# Patient Record
Sex: Male | Born: 1965 | Race: Black or African American | Hispanic: No | Smoking: Current every day smoker
Health system: Southern US, Community
[De-identification: ages and names within clinical notes are randomized; demographics above are authoritative.]

## PROBLEM LIST (undated history)

## (undated) DIAGNOSIS — B351 Tinea unguium: Secondary | ICD-10-CM

## (undated) DIAGNOSIS — I4891 Unspecified atrial fibrillation: Secondary | ICD-10-CM

## (undated) DIAGNOSIS — F1010 Alcohol abuse, uncomplicated: Secondary | ICD-10-CM

## (undated) DIAGNOSIS — Z87891 Personal history of nicotine dependence: Secondary | ICD-10-CM

## (undated) HISTORY — DX: Personal history of nicotine dependence: Z87.891

## (undated) HISTORY — DX: Tinea unguium: B35.1

## (undated) HISTORY — DX: Alcohol abuse, uncomplicated: F10.10

## (undated) HISTORY — DX: Unspecified atrial fibrillation: I48.91

---

## 1999-04-07 DIAGNOSIS — Z794 Long term (current) use of insulin: Secondary | ICD-10-CM

## 1999-04-07 DIAGNOSIS — E119 Type 2 diabetes mellitus without complications: Secondary | ICD-10-CM | POA: Insufficient documentation

## 1999-08-13 ENCOUNTER — Emergency Department (HOSPITAL_COMMUNITY): Admission: EM | Admit: 1999-08-13 | Discharge: 1999-08-13 | Payer: Self-pay | Admitting: Emergency Medicine

## 1999-08-15 ENCOUNTER — Emergency Department (HOSPITAL_COMMUNITY): Admission: EM | Admit: 1999-08-15 | Discharge: 1999-08-15 | Payer: Self-pay | Admitting: *Deleted

## 1999-08-19 ENCOUNTER — Encounter: Admission: RE | Admit: 1999-08-19 | Discharge: 1999-08-19 | Payer: Self-pay | Admitting: Hematology and Oncology

## 1999-08-24 ENCOUNTER — Encounter: Admission: RE | Admit: 1999-08-24 | Discharge: 1999-11-22 | Payer: Self-pay | Admitting: *Deleted

## 1999-08-26 ENCOUNTER — Encounter: Admission: RE | Admit: 1999-08-26 | Discharge: 1999-08-26 | Payer: Self-pay | Admitting: Hematology and Oncology

## 1999-09-14 ENCOUNTER — Encounter: Admission: RE | Admit: 1999-09-14 | Discharge: 1999-09-14 | Payer: Self-pay | Admitting: Internal Medicine

## 1999-11-09 ENCOUNTER — Encounter: Admission: RE | Admit: 1999-11-09 | Discharge: 1999-11-09 | Payer: Self-pay | Admitting: Internal Medicine

## 2000-03-15 ENCOUNTER — Emergency Department (HOSPITAL_COMMUNITY): Admission: EM | Admit: 2000-03-15 | Discharge: 2000-03-15 | Payer: Self-pay | Admitting: Emergency Medicine

## 2000-03-23 ENCOUNTER — Emergency Department (HOSPITAL_COMMUNITY): Admission: EM | Admit: 2000-03-23 | Discharge: 2000-03-23 | Payer: Self-pay | Admitting: Emergency Medicine

## 2000-04-03 ENCOUNTER — Encounter: Payer: Self-pay | Admitting: Emergency Medicine

## 2000-04-03 ENCOUNTER — Emergency Department (HOSPITAL_COMMUNITY): Admission: EM | Admit: 2000-04-03 | Discharge: 2000-04-03 | Payer: Self-pay | Admitting: Emergency Medicine

## 2000-10-31 ENCOUNTER — Emergency Department (HOSPITAL_COMMUNITY): Admission: EM | Admit: 2000-10-31 | Discharge: 2000-11-01 | Payer: Self-pay | Admitting: Emergency Medicine

## 2001-02-07 ENCOUNTER — Emergency Department (HOSPITAL_COMMUNITY): Admission: EM | Admit: 2001-02-07 | Discharge: 2001-02-07 | Payer: Self-pay | Admitting: Emergency Medicine

## 2003-10-15 ENCOUNTER — Encounter: Admission: RE | Admit: 2003-10-15 | Discharge: 2003-10-15 | Payer: Self-pay | Admitting: Internal Medicine

## 2003-10-29 ENCOUNTER — Encounter: Admission: RE | Admit: 2003-10-29 | Discharge: 2003-10-29 | Payer: Self-pay | Admitting: Internal Medicine

## 2003-12-03 ENCOUNTER — Encounter: Admission: RE | Admit: 2003-12-03 | Discharge: 2003-12-03 | Payer: Self-pay | Admitting: Internal Medicine

## 2003-12-06 ENCOUNTER — Ambulatory Visit (HOSPITAL_COMMUNITY): Admission: RE | Admit: 2003-12-06 | Discharge: 2003-12-06 | Payer: Self-pay | Admitting: Internal Medicine

## 2003-12-06 ENCOUNTER — Encounter: Admission: RE | Admit: 2003-12-06 | Discharge: 2003-12-06 | Payer: Self-pay | Admitting: Internal Medicine

## 2003-12-13 ENCOUNTER — Encounter: Admission: RE | Admit: 2003-12-13 | Discharge: 2003-12-13 | Payer: Self-pay | Admitting: Internal Medicine

## 2004-02-26 ENCOUNTER — Ambulatory Visit: Payer: Self-pay | Admitting: Internal Medicine

## 2004-04-08 ENCOUNTER — Encounter (HOSPITAL_BASED_OUTPATIENT_CLINIC_OR_DEPARTMENT_OTHER): Admission: RE | Admit: 2004-04-08 | Discharge: 2004-04-21 | Payer: Self-pay | Admitting: Internal Medicine

## 2004-05-31 DIAGNOSIS — I4891 Unspecified atrial fibrillation: Secondary | ICD-10-CM

## 2004-05-31 HISTORY — DX: Unspecified atrial fibrillation: I48.91

## 2005-02-23 ENCOUNTER — Emergency Department (HOSPITAL_COMMUNITY): Admission: EM | Admit: 2005-02-23 | Discharge: 2005-02-23 | Payer: Self-pay | Admitting: Emergency Medicine

## 2005-03-02 ENCOUNTER — Emergency Department (HOSPITAL_COMMUNITY): Admission: EM | Admit: 2005-03-02 | Discharge: 2005-03-02 | Payer: Self-pay | Admitting: Family Medicine

## 2005-03-09 ENCOUNTER — Ambulatory Visit: Payer: Self-pay | Admitting: Cardiology

## 2005-03-09 ENCOUNTER — Ambulatory Visit: Payer: Self-pay | Admitting: Internal Medicine

## 2005-03-09 ENCOUNTER — Encounter (INDEPENDENT_AMBULATORY_CARE_PROVIDER_SITE_OTHER): Payer: Self-pay | Admitting: Cardiology

## 2005-03-09 ENCOUNTER — Inpatient Hospital Stay (HOSPITAL_COMMUNITY): Admission: EM | Admit: 2005-03-09 | Discharge: 2005-03-10 | Payer: Self-pay | Admitting: Emergency Medicine

## 2005-03-23 ENCOUNTER — Ambulatory Visit: Payer: Self-pay

## 2005-06-14 ENCOUNTER — Ambulatory Visit: Payer: Self-pay | Admitting: Internal Medicine

## 2005-08-24 ENCOUNTER — Ambulatory Visit: Payer: Self-pay | Admitting: Internal Medicine

## 2005-10-11 ENCOUNTER — Encounter (INDEPENDENT_AMBULATORY_CARE_PROVIDER_SITE_OTHER): Payer: Self-pay | Admitting: *Deleted

## 2005-10-22 ENCOUNTER — Emergency Department (HOSPITAL_COMMUNITY): Admission: EM | Admit: 2005-10-22 | Discharge: 2005-10-22 | Payer: Self-pay | Admitting: Emergency Medicine

## 2005-10-26 ENCOUNTER — Ambulatory Visit: Payer: Self-pay | Admitting: Internal Medicine

## 2005-10-26 ENCOUNTER — Encounter (INDEPENDENT_AMBULATORY_CARE_PROVIDER_SITE_OTHER): Payer: Self-pay | Admitting: *Deleted

## 2005-10-27 ENCOUNTER — Encounter (INDEPENDENT_AMBULATORY_CARE_PROVIDER_SITE_OTHER): Payer: Self-pay | Admitting: *Deleted

## 2005-10-27 LAB — CONVERTED CEMR LAB: Microalbumin U total vol: 0.7 mg/L

## 2006-01-19 ENCOUNTER — Emergency Department (HOSPITAL_COMMUNITY): Admission: EM | Admit: 2006-01-19 | Discharge: 2006-01-19 | Payer: Self-pay | Admitting: Emergency Medicine

## 2006-04-06 ENCOUNTER — Encounter (INDEPENDENT_AMBULATORY_CARE_PROVIDER_SITE_OTHER): Payer: Self-pay | Admitting: *Deleted

## 2006-04-06 DIAGNOSIS — F1011 Alcohol abuse, in remission: Secondary | ICD-10-CM

## 2006-04-06 DIAGNOSIS — I480 Paroxysmal atrial fibrillation: Secondary | ICD-10-CM | POA: Insufficient documentation

## 2006-04-06 DIAGNOSIS — B351 Tinea unguium: Secondary | ICD-10-CM | POA: Insufficient documentation

## 2006-04-06 DIAGNOSIS — F17201 Nicotine dependence, unspecified, in remission: Secondary | ICD-10-CM | POA: Insufficient documentation

## 2006-05-03 ENCOUNTER — Encounter (INDEPENDENT_AMBULATORY_CARE_PROVIDER_SITE_OTHER): Payer: Self-pay | Admitting: *Deleted

## 2006-05-03 ENCOUNTER — Ambulatory Visit: Payer: Self-pay | Admitting: *Deleted

## 2007-02-10 ENCOUNTER — Ambulatory Visit: Payer: Self-pay | Admitting: Internal Medicine

## 2007-02-10 ENCOUNTER — Encounter (INDEPENDENT_AMBULATORY_CARE_PROVIDER_SITE_OTHER): Payer: Self-pay | Admitting: *Deleted

## 2007-02-10 LAB — CONVERTED CEMR LAB
Creatinine, Urine: 108.2 mg/dL
Leukocytes, UA: NEGATIVE
Microalb Creat Ratio: 8.4 mg/g (ref 0.0–30.0)
Microalb, Ur: 0.91 mg/dL (ref 0.00–1.89)
Potassium: 4.4 meq/L (ref 3.5–5.3)
Protein, ur: NEGATIVE mg/dL
RBC / HPF: NONE SEEN (ref <3)
Sodium: 136 meq/L (ref 135–145)
Specific Gravity, Urine: 1.031 — ABNORMAL HIGH (ref 1.005–1.03)
Urine Glucose: >1000 mg/dL — AB
WBC, UA: NONE SEEN cells/hpf (ref <3)
pH: 6 (ref 5.0–8.0)

## 2007-02-23 ENCOUNTER — Ambulatory Visit: Payer: Self-pay | Admitting: Internal Medicine

## 2007-02-23 ENCOUNTER — Encounter (INDEPENDENT_AMBULATORY_CARE_PROVIDER_SITE_OTHER): Payer: Self-pay | Admitting: *Deleted

## 2007-02-23 LAB — CONVERTED CEMR LAB: Blood Glucose, Fingerstick: 196

## 2007-03-20 ENCOUNTER — Encounter: Payer: Self-pay | Admitting: *Deleted

## 2007-03-29 ENCOUNTER — Encounter (INDEPENDENT_AMBULATORY_CARE_PROVIDER_SITE_OTHER): Payer: Self-pay | Admitting: *Deleted

## 2007-03-29 ENCOUNTER — Ambulatory Visit: Payer: Self-pay | Admitting: *Deleted

## 2008-03-10 ENCOUNTER — Emergency Department (HOSPITAL_COMMUNITY): Admission: EM | Admit: 2008-03-10 | Discharge: 2008-03-10 | Payer: Self-pay | Admitting: Emergency Medicine

## 2009-03-03 ENCOUNTER — Encounter: Payer: Self-pay | Admitting: Internal Medicine

## 2009-03-03 ENCOUNTER — Ambulatory Visit: Payer: Self-pay | Admitting: Internal Medicine

## 2009-03-03 LAB — CONVERTED CEMR LAB
Albumin: 4.5 g/dL (ref 3.5–5.2)
CO2: 19 meq/L (ref 19–32)
Cholesterol: 285 mg/dL — ABNORMAL HIGH (ref 0–200)
Glucose, Bld: 334 mg/dL — ABNORMAL HIGH (ref 70–99)
MCV: 86.7 fL (ref >=78.0)
Microalb, Ur: 1.72 mg/dL (ref 0.00–1.89)
RBC: 5.04 M/uL (ref 4.22–5.81)
Sodium: 135 meq/L (ref 135–145)
Total Bilirubin: 0.8 mg/dL (ref 0.3–1.2)
Total Protein: 7.3 g/dL (ref 6.0–8.3)
Triglycerides: 145 mg/dL (ref <150)
VLDL: 29 mg/dL (ref 0–40)
WBC: 6 10*3/uL (ref 4.0–10.5)

## 2009-03-24 ENCOUNTER — Telehealth: Payer: Self-pay | Admitting: Internal Medicine

## 2009-04-02 ENCOUNTER — Telehealth (INDEPENDENT_AMBULATORY_CARE_PROVIDER_SITE_OTHER): Payer: Self-pay | Admitting: *Deleted

## 2009-04-02 ENCOUNTER — Telehealth: Payer: Self-pay | Admitting: *Deleted

## 2009-04-10 ENCOUNTER — Ambulatory Visit: Payer: Self-pay | Admitting: Internal Medicine

## 2009-05-01 ENCOUNTER — Ambulatory Visit: Payer: Self-pay | Admitting: Internal Medicine

## 2009-05-01 LAB — CONVERTED CEMR LAB: Blood Glucose, Fingerstick: 256

## 2009-07-18 ENCOUNTER — Telehealth: Payer: Self-pay | Admitting: *Deleted

## 2009-08-01 ENCOUNTER — Ambulatory Visit: Payer: Self-pay | Admitting: Internal Medicine

## 2009-08-01 ENCOUNTER — Encounter: Payer: Self-pay | Admitting: Internal Medicine

## 2009-08-01 DIAGNOSIS — E785 Hyperlipidemia, unspecified: Secondary | ICD-10-CM

## 2009-08-01 LAB — CONVERTED CEMR LAB
AST: 14 units/L (ref 0–37)
Albumin: 4.2 g/dL (ref 3.5–5.2)
Alkaline Phosphatase: 74 units/L (ref 39–117)
Blood Glucose, Fingerstick: 251
Hgb A1c MFr Bld: 8.8 %
Potassium: 4.1 meq/L (ref 3.5–5.3)
Sodium: 137 meq/L (ref 135–145)
Total Protein: 7.1 g/dL (ref 6.0–8.3)

## 2009-09-22 ENCOUNTER — Ambulatory Visit: Payer: Self-pay | Admitting: Internal Medicine

## 2009-09-22 LAB — CONVERTED CEMR LAB
ALT: 12 units/L (ref 0–53)
Alkaline Phosphatase: 62 units/L (ref 39–117)
Indirect Bilirubin: 0.7 mg/dL (ref 0.0–0.9)
Total Protein: 7.2 g/dL (ref 6.0–8.3)

## 2009-11-25 ENCOUNTER — Encounter: Payer: Self-pay | Admitting: Internal Medicine

## 2009-11-25 ENCOUNTER — Ambulatory Visit: Payer: Self-pay | Admitting: Internal Medicine

## 2009-11-25 ENCOUNTER — Ambulatory Visit (HOSPITAL_COMMUNITY): Admission: RE | Admit: 2009-11-25 | Discharge: 2009-11-25 | Payer: Self-pay | Admitting: Internal Medicine

## 2009-12-03 ENCOUNTER — Ambulatory Visit: Payer: Self-pay | Admitting: Internal Medicine

## 2009-12-03 LAB — CONVERTED CEMR LAB
CO2: 18 meq/L — ABNORMAL LOW (ref 19–32)
Calcium: 8.9 mg/dL (ref 8.4–10.5)
Cholesterol: 168 mg/dL (ref 0–200)
Creatinine, Ser: 0.99 mg/dL (ref 0.40–1.50)
Glucose, Bld: 153 mg/dL — ABNORMAL HIGH (ref 70–99)
Sodium: 143 meq/L (ref 135–145)
Total Bilirubin: 0.7 mg/dL (ref 0.3–1.2)
Total Protein: 6.7 g/dL (ref 6.0–8.3)
Triglycerides: 82 mg/dL (ref <150)
VLDL: 16 mg/dL (ref 0–40)

## 2009-12-16 ENCOUNTER — Emergency Department (HOSPITAL_COMMUNITY): Admission: EM | Admit: 2009-12-16 | Discharge: 2009-12-16 | Payer: Self-pay | Admitting: Family Medicine

## 2010-02-27 ENCOUNTER — Telehealth: Payer: Self-pay | Admitting: *Deleted

## 2010-03-31 ENCOUNTER — Ambulatory Visit: Payer: Self-pay | Admitting: Internal Medicine

## 2010-03-31 DIAGNOSIS — G43009 Migraine without aura, not intractable, without status migrainosus: Secondary | ICD-10-CM | POA: Insufficient documentation

## 2010-03-31 LAB — HM DIABETES FOOT EXAM

## 2010-03-31 LAB — CONVERTED CEMR LAB
Blood Glucose, Fingerstick: 198
Creatinine, Urine: 186.2 mg/dL
Microalb Creat Ratio: 2.7 mg/g (ref 0.0–30.0)

## 2010-04-03 ENCOUNTER — Telehealth: Payer: Self-pay | Admitting: Internal Medicine

## 2010-04-09 ENCOUNTER — Telehealth: Payer: Self-pay | Admitting: Licensed Clinical Social Worker

## 2010-04-09 ENCOUNTER — Encounter: Payer: Self-pay | Admitting: Licensed Clinical Social Worker

## 2010-06-21 ENCOUNTER — Encounter: Payer: Self-pay | Admitting: Internal Medicine

## 2010-06-30 NOTE — Letter (Signed)
Summary: METER DOWNLOAD  METER DOWNLOAD   Imported By: Margie Billet 11/27/2009 10:21:38  _____________________________________________________________________  External Attachment:    Type:   Image     Comment:   External Document

## 2010-06-30 NOTE — Letter (Signed)
Summary: Pharmacologist   Imported By: Florinda Marker 08/05/2009 16:36:56  _____________________________________________________________________  External Attachment:    Type:   Image     Comment:   External Document

## 2010-06-30 NOTE — Assessment & Plan Note (Signed)
Summary: NOT HFU 2 MONTH RECHECK PER [facility]   Vital Signs:  Patient profile:   45 year old male Height:      72.25 inches (183.52 cm) Weight:      204.5 pounds (92.95 kg) BMI:     27.64 Temp:     98.8 degrees F (37.11 degrees C) oral Pulse rate:   105 / minute BP sitting:   125 / 80  (left arm) Cuff size:   large  Vitals Entered By: Cynda Familia Duncan Dull) (September 22, 2009 1:36 PM) CC: , Lipid Management Is Patient Diabetic? Yes  Does patient need assistance? Functional Status Self care Ambulation Normal   Primary Care Provider:  Deatra Robinson MD  CC:   and Lipid Management.  History of Present Illness: F/U appointment: 1. DM -takes his meds as prescribed (uses samples of Novlog 70/30 flexipens) --denies hypoglycemia; watches his diet. 2. HLD -started on statin 2 months ago. Denies any side-effects. 3. Could not f/u with a Fundoscopic exam --awaits  [place] "orange card."  Lipid Management History:      Positive NCEP/ATP III risk factors include male age 88 years old or older and diabetes.  Negative NCEP/ATP III risk factors include non-tobacco-user status.        The patient states that he knows about the "Therapeutic Lifestyle Change" diet.  His compliance with the TLC diet is good.  The patient expresses understanding of adjunctive measures for cholesterol lowering.  Adjunctive measures started by the patient include aerobic exercise, fiber, ASA, and limit alcohol consumpton.  He expresses no side effects from his lipid-lowering medication.  The patient denies any symptoms to suggest myopathy or liver disease.     Preventive Screening-Counseling & Management  Alcohol-Tobacco     Alcohol drinks/day: 0     Alcohol type: BEER     Smoking Status: quit     Packs/Day: 0.5     Year Started: 1990     Year Quit: 2009  Current Problems (verified): 1)  Hyperlipidemia  (ICD-272.4) 2)  Onychomycosis, Toenails  (ICD-110.1) 3)  Tobacco Abuse   (ICD-305.1) 4)  Alcohol Abuse  (ICD-305.00) 5)  Diabetes Mellitus, Type II  (ICD-250.00) 6)  Atrial Fibrillation  (ICD-427.31)  Current Medications (verified): 1)  Novolin 70/30 70-30 % Susp (Insulin Isophane & Regular) .... Inject 15 Units Subcutaneously in The Morning and 20 Units in The Evening. 2)  Truetrack Test  Strp (Glucose Blood) .... Use To Check Blood Sugars Three Times A Day 3)  Lancets  Misc (Lancets) .... Use To Check Blood Sugar 3x Daily Before Meals 4)  Aspirin 81 Mg Chew (Aspirin) .... Take 1 Tablet By Mouth Once A Day With Meals 5)  Pravastatin Sodium 40 Mg Tabs (Pravastatin Sodium) .... Take 1 Tablet By Mouth Once A Day  Allergies (verified): No Known Drug Allergies  Past History:  Family History: Last updated: 03/03/2009 mother --MI at age of 26 y/o, DM, HTN father --cancer (unknown type); at unknown age  Social History: Last updated: 03/03/2009 Current Smoker Alcohol use-quit in 2009. Illicit drugs --no  Dropped out of the 11th grade in high school used to work in [employer]. No family in [place] (2 sisters and a brother in [place]. [place])  Risk Factors: Smoking Status: quit (09/22/2009) Packs/Day: 0.5 (09/22/2009)  Past medical, surgical, family and social histories (including risk factors) reviewed for relevance to current acute and chronic problems.  Past Medical History: Reviewed history from 04/06/2006 and no changes required. onychomycosis  Diabetes mellitus, type II:  uncontrolled alcohol abuse tobacco abuse Atrial  fibrillation with RVR in 2006 with spontaneous conversion  Family History: Reviewed history from 03/03/2009 and no changes required. mother --MI at age of 39 y/o, DM, HTN father --cancer (unknown type); at unknown age  Social History: Reviewed history from 03/03/2009 and no changes required. Current Smoker Alcohol use-quit in 2009. Illicit drugs --no  Dropped out of the 11th grade in high school used to work in  [employer]. No family in [place] (2 sisters and a brother in [place]. [place])  Review of Systems       per HPI  Physical Exam  General:  alert and well-developed.  alert and well-developed.   Head:  no abnormalities observed.  no abnormalities observed.   Eyes:  vision grossly intact by Snellen chart. EOMI bilaterally; no icterus bialterally. Ears:  no external deformities.   Nose:  no external erythema and no nasal discharge.  no external erythema and no nasal discharge.   Mouth:  pharynx pink and moist, no erythema, no exudates, no postnasal drip, no lesions, and no tongue abnormalities.  pharynx pink and moist, no erythema, no exudates, no postnasal drip, no lesions, and no tongue abnormalities.   Neck:  supple, full ROM, and no masses.  supple, full ROM, and no masses.   Lungs:  normal respiratory effort and normal breath sounds.   Heart:  normal rate and regular rhythm.  normal rate and regular rhythm.   Abdomen:  soft and non-tender.  soft and non-tender.   Msk:  normal ROM, no joint tenderness, no joint swelling, no joint warmth, no redness over joints, no joint deformities, no joint instability, and no crepitation.    Pulses:  normal peripheral pulses  Extremities:  no cyanosis, clubbing or edema  Neurologic:  non focal. alert & oriented X3, strength normal in all extremities, sensation intact to light touch, gait normal, DTRs symmetrical and normal, finger-to-nose normal, and heel-to-shin normal.   Skin:  turgor normal, color normal, no rashes, and no suspicious lesions.  Psych:  normally interactive.  normally interactive.    Diabetes Management Exam:    Foot Exam (with socks and/or shoes not present):       Sensory-Pinprick/Light touch:          Left medial foot (L-4): normal          Left dorsal foot (L-5): normal          Left lateral foot (S-1): normal          Right medial foot (L-4): normal          Right dorsal foot (L-5): normal          Right lateral foot  (S-1): normal       Sensory-Monofilament:          Left foot: normal          Right foot: normal       Inspection:          Left foot: normal          Right foot: normal       Nails:          Left foot: thickened          Right foot: thickened   Impression & Recommendations:  Problem # 1:  HYPERLIPIDEMIA (ICD-272.4)  Started on Statin 2 months ago. Denies any side-effects. Will check LFT's today. Diet discussed. His updated medication list for this problem includes:  Pravastatin Sodium 40 Mg Tabs (Pravastatin sodium) .Marland Kitchen... Take 1 tablet by mouth once a day  Orders: T-Hepatic Function [phone])  Labs Reviewed: SGOT: 14 (08/01/2009)   SGPT: 15 (08/01/2009)   HDL:56 (03/03/2009)  LDL:200 (03/03/2009)  Chol:285 (03/03/2009)  Trig:145 (03/03/2009)  Problem # 2:  DIABETES MELLITUS, TYPE II (ICD-250.00)  Medications, signs of hypoglycemia, diet, exercise,foot care reviewed with the patient. Patient is to see Ms. Hulen Shouts for DME. Awaiting for an "Orange card" in order to be referrred for a fundoscopic exam. His updated medication list for this problem includes:    Novolin 70/30 70-30 % Susp (Insulin isophane & regular) ..... Inject 15 units subcutaneously in the morning and 20 units in the evening.    Aspirin 81 Mg Chew (Aspirin) .Marland Kitchen... Take 1 tablet by mouth once a day with meals  Labs Reviewed: Creat: 0.95 (08/01/2009)   Microalbumin: 0.70 (10/27/2005) Reviewed HgBA1c results: 8.8 (08/01/2009)  >14.0 (03/03/2009)  Complete Medication List: 1)  Novolin 70/30 70-30 % Susp (Insulin isophane & regular) .... Inject 15 units subcutaneously in the morning and 20 units in the evening. 2)  Truetrack Test Strp (Glucose blood) .... Use to check blood sugars three times a day 3)  Lancets Misc (Lancets) .... Use to check blood sugar 3x daily before meals 4)  Aspirin 81 Mg Chew (Aspirin) .... Take 1 tablet by mouth once a day with meals 5)  Pravastatin Sodium 40 Mg Tabs (Pravastatin  sodium) .... Take 1 tablet by mouth once a day  Lipid Assessment/Plan:      Based on NCEP/ATP III, the patient's risk factor category is "history of diabetes".  The patient's lipid goals are as follows: Total cholesterol goal is 200; LDL cholesterol goal is 100; HDL cholesterol goal is 40; Triglyceride goal is 150.     Patient Instructions: 1)  Please, make an appointment with Ms. Rhiley for diabetic teaching once every 3 months. 2)  Please, make a follow up appintment at the end of June . 3)  Make sure to take your medications as prescribed. 4)  Please, bring your glucose meter with each appointment. 5)  Follow up with an eye exam. Process Orders Check Orders Results:     [facility]: ABN not required for this insurance Tests Sent for requisitioning (September 22, 2009 4:21 PM):     09/22/2009: [facility] -- T-Hepatic Function [phone] (signed)    Prevention & Chronic Care Immunizations   Influenza vaccine: refuses  (03/29/2007)   Influenza vaccine deferral: Refused  (03/03/2009)    Tetanus booster: Not documented   Td booster deferral: Refused  (03/03/2009)    Pneumococcal vaccine: Not documented  Other Screening   PSA: Not documented   Smoking status: quit  (09/22/2009)  Diabetes Mellitus   HgbA1C: 8.8  (08/01/2009)   HgbA1C action/deferral: Ordered  (03/03/2009)   Hemoglobin A1C due: 06/03/2009    Eye exam: Not documented   Diabetic eye exam action/deferral: Ophthalmology referral  (03/03/2009)    Foot exam: yes  (09/22/2009)   Foot exam action/deferral: Do today   High risk foot: Not documented   Foot care education: Done  (03/03/2009)    Urine microalbumin/creatinine ratio: 18.4  (03/03/2009)   Urine microalbumin action/deferral: Ordered   Urine microalbumin/cr due: 03/03/2010  Lipids   Total Cholesterol: 285  (03/03/2009)   Lipid panel action/deferral: Lipid Panel ordered   LDL: 200  (03/03/2009)   LDL Direct: Not  documented   HDL: 56  (03/03/2009)  Triglycerides: 145  (03/03/2009)   Lipid panel due: 09/01/2009    SGOT (AST): 14  (08/01/2009)   SGPT (ALT): 15  (08/01/2009)   Alkaline phosphatase: 74  (08/01/2009)   Total bilirubin: 1.0  (08/01/2009)  Self-Management Support :   Personal Goals (by the next clinic visit) :     Personal A1C goal: 8  (04/10/2009)     Personal blood pressure goal: 140/90  (04/10/2009)     Personal LDL goal: 100  (04/10/2009)    Patient will work on the following items until the next clinic visit to reach self-care goals:     Medications and monitoring: take my medicines every day  (09/22/2009)     Eating: drink diet soda or water instead of juice or soda, eat foods that are low in salt, eat baked foods instead of fried foods  (09/22/2009)     Activity: join a walking program  (09/22/2009)     Other: bring meter every visit  (09/22/2009)    Diabetes self-management support: Pre-printed educational material, Resources for patients handout, Written self-care plan  (09/22/2009)   Diabetes care plan printed    Lipid self-management support: Pre-printed educational material, Resources for patients handout, Written self-care plan  (09/22/2009)   Lipid self-care plan printed.      Resource handout printed.

## 2010-06-30 NOTE — Progress Notes (Signed)
  Phone Note Outgoing Call   Call placed by: Theotis Barrio NT II,  July 18, 2009 11:37 AM Call placed to: Patient Details for Reason: [facility] Summary of Call: CALLED AND LEFT VOICE MESSAGE FOR THE PATIENT TO CALL [facility] / SECOND # IS A FAX NUMBER. Yi Falletta NT II  July 18, 2009 11:38 AM

## 2010-06-30 NOTE — Assessment & Plan Note (Signed)
Summary: NOT HFU PER Seleta Hovland F/U VISIT/CH   Vital Signs:  Patient profile:   45 year old male Height:      72.25 inches (183.52 cm) Weight:      203.6 pounds (92.55 kg) BMI:     27.52 Temp:     97.7 degrees F (36.50 degrees C) oral Pulse rate:   91 / minute BP sitting:   131 / 83  (right arm)  Vitals Entered By: Cynda Familia Duncan Dull) (November 25, 2009 2:09 PM) Is Patient Diabetic? Yes Did you bring your meter with you today? Yes Pain Assessment Patient in pain? no      Nutritional Status BMI of > 30 = obese CBG Result 215  Have you ever been in a relationship where you felt threatened, hurt or afraid?No   Does patient need assistance? Functional Status Self care Ambulation Normal   Primary Care Provider:  Deatra Robinson MD   History of Present Illness: 1. F/u on DM. Takes all his medications as prescribed. Now covered though [employer]. Denies any hypoglycemic events. 2. HLD-denies any side-effects with Pravastatin. 3. Continues to smoke  Preventive Screening-Counseling & Management  Alcohol-Tobacco     Alcohol drinks/day: 0     Alcohol type: BEER     Smoking Status: quit     Packs/Day: 0.5     Year Started: 1990     Year Quit: 2010  Current Problems (verified): 1)  Hyperlipidemia  (ICD-272.4) 2)  Onychomycosis, Toenails  (ICD-110.1) 3)  Tobacco Abuse  (ICD-305.1) 4)  Alcohol Abuse  (ICD-305.00) 5)  Diabetes Mellitus, Type II  (ICD-250.00) 6)  Atrial Fibrillation  (ICD-427.31)  Medications Prior to Update: 1)  Novolin 70/30 70-30 % Susp (Insulin Isophane & Regular) .... Inject 15 Units Subcutaneously in The Morning and 20 Units in The Evening. 2)  Truetrack Test  Strp (Glucose Blood) .... Use To Check Blood Sugars Three Times A Day 3)  Lancets  Misc (Lancets) .... Use To Check Blood Sugar 3x Daily Before Meals 4)  Aspirin 81 Mg Chew (Aspirin) .... Take 1 Tablet By Mouth Once A Day With Meals 5)  Pravastatin Sodium 40 Mg Tabs (Pravastatin  Sodium) .... Take 1 Tablet By Mouth Once A Day  Allergies (verified): No Known Drug Allergies  Past History:  Family History: Last updated: 03/03/2009 mother --MI at age of 28 y/o, DM, HTN father --cancer (unknown type); at unknown age  Social History: Last updated: 03/03/2009 Current Smoker Alcohol use-quit in 2009. Illicit drugs --no  Dropped out of the 11th grade in high school used to work in [employer]. No family in [place] (2 sisters and a brother in [place]. [place])  Risk Factors: Smoking Status: quit (11/25/2009) Packs/Day: 0.5 (11/25/2009)  Past medical, surgical, family and social histories (including risk factors) reviewed, and no changes noted (except as noted below).  Past Medical History: Reviewed history from 04/06/2006 and no changes required. onychomycosis Diabetes mellitus, type II:  uncontrolled alcohol abuse tobacco abuse Atrial  fibrillation with RVR in 2006 with spontaneous conversion  Family History: Reviewed history from 03/03/2009 and no changes required. mother --MI at age of 41 y/o, DM, HTN father --cancer (unknown type); at unknown age  Social History: Reviewed history from 03/03/2009 and no changes required. Current Smoker Alcohol use-quit in 2009. Illicit drugs --no  Dropped out of the 11th grade in high school used to work in [employer]. No family in [place] (2 sisters and a brother in [place])  Review of  Systems       per HPI  Physical Exam  General:  alert and well-developed.  alert and well-developed.   Head:  no abnormalities observed.  no abnormalities observed.   Eyes:  vision grossly intact by Snellen chart. EOMI bilaterally; no icterus bialterally. Ears:  no external deformities.   Nose:  no external erythema and no nasal discharge.  no external erythema and no nasal discharge.   Mouth:  pharynx pink and moist, no erythema, no exudates, no postnasal drip, no lesions, and no tongue abnormalities.   pharynx pink and moist, no erythema, no exudates, no postnasal drip, no lesions, and no tongue abnormalities.   Neck:  supple, full ROM, and no masses.  supple, full ROM, and no masses.   Lungs:  normal respiratory effort and normal breath sounds.   Heart:  normal rate and regular rhythm.  normal rate and regular rhythm.   Abdomen:  soft and non-tender.  soft and non-tender.   Msk:  normal ROM, no joint tenderness, no joint swelling, no joint warmth, no redness over joints, no joint deformities, no joint instability, and no crepitation.    Pulses:  normal peripheral pulses  Extremities:  no cyanosis, clubbing or edema  Neurologic:  alert & oriented X3, sensation intact to light touch, gait normal, and DTRs symmetrical and normal.   Skin:  turgor normal, color normal, no rashes, and no suspicious lesions.  Psych:  normally interactive.   Oriented X3, memory intact for recent and remote, good eye contact, not anxious appearing, and not depressed appearing.    Diabetes Management Exam:    Foot Exam (with socks and/or shoes not present):       Sensory-Pinprick/Light touch:          Left medial foot (L-4): normal          Left dorsal foot (L-5): normal          Left lateral foot (S-1): normal          Right medial foot (L-4): normal          Right dorsal foot (L-5): normal          Right lateral foot (S-1): normal       Sensory-Monofilament:          Left foot: normal          Right foot: normal       Inspection:          Left foot: normal          Right foot: normal       Nails:          Left foot: normal          Right foot: normal   Impression & Recommendations:  Problem # 1:  DIABETES MELLITUS, TYPE II (ICD-250.00)  Increase Insulin to 20 Units Subcutaneously in am and 25 Units Sq at bedtime. Sx of hypoglycemia reviewed. Diet discussed. Patient is to follow up with his opthalmology referral. His updated medication list for this problem includes:    Novolin 70/30 70-30 % Susp  (Insulin isophane & regular) ..... Inject 20 units subcutaneously in the morning and 25 units in the evening.    Aspirin 81 Mg Chew (Aspirin) .Marland Kitchen... Take 1 tablet by mouth once a day with meals  Orders: T-Hgb A1C (in-house) ([id number]) T- Capillary Blood Glucose ([id number]) T-Comprehensive Metabolic Panel ([id number]) T-Hgb A1C (in-house) ([id number])  Labs Reviewed: Creat: 0.95 (08/01/2009)   Microalbumin: 0.70 (10/27/2005) Reviewed  HgBA1c results: 8.7 (11/25/2009)  8.8 (08/01/2009)  Problem # 2:  HYPERLIPIDEMIA (ICD-272.4)  Denies any side-effects with Pravastatin. Diet, exercise discussed. Patient maintaince abstinence from ETOH. His updated medication list for this problem includes:    Pravastatin Sodium 40 Mg Tabs (Pravastatin sodium) .Marland Kitchen... Take 1 tablet by mouth once a day  Orders: T-Comprehensive Metabolic Panel [id number]) T-Lipid Profile ([id number])  Labs Reviewed: SGOT: 14 (09/22/2009)   SGPT: 12 (09/22/2009)  Lipid Goals: Chol Goal: 200 (09/22/2009)   HDL Goal: 40 (09/22/2009)   LDL Goal: 100 (09/22/2009)   TG Goal: 150 (09/22/2009)  Prior 10 Yr Risk Heart Disease: 11 % (09/22/2009)   HDL:56 (03/03/2009)  LDL:200 (03/03/2009)  Chol:285 (03/03/2009)  Trig:145 (03/03/2009)  Problem # 3:  TOBACCO ABUSE (ICD-305.1)  Encouraged smoking cessation and discussed different methods for smoking cessation.   Complete Medication List: 1)  Novolin 70/30 70-30 % Susp (Insulin isophane & regular) .... Inject 20 units subcutaneously in the morning and 25 units in the evening. 2)  Truetrack Test Strp (Glucose blood) .... Use to check blood sugars three times a day 3)  Lancets Misc (Lancets) .... Use to check blood sugar 3x daily before meals 4)  Aspirin 81 Mg Chew (Aspirin) .... Take 1 tablet by mouth once a day with meals 5)  Pravastatin Sodium 40 Mg Tabs (Pravastatin sodium) .... Take 1 tablet by mouth once a day 6)  Lancets Misc (Lancets) .... Check blood glucose before breakfast  once a day 7)  Sm Insulin Syringe 31g X 5/16" 0.5 Ml Misc (Insulin syringe-needle u-100) .... Use with novolog 70/30 as instructed twice daily  Other Orders: 12 Lead EKG (12 Lead EKG)  Patient Instructions: 1)  Please, increase Novolog Insulin 70/30 dose to: 2)  20 Units every morning befire breakfast and 25 units before dinner. 3)  YOur prescriptions were faxed to yourpharmacy. 4)  Call with any questions. 5)  Please, make an appointment for a blood draw --you need to be FASTING for 12 hours (nothing by mouth except water). Do not inject insulin if skip meals. 6)  Follow up in 3-4 months. Prescriptions: NOVOLIN 70/30 70-30 % SUSP (INSULIN ISOPHANE & REGULAR) Inject 20 units subcutaneously in the morning and 25 units in the evening.  #100 x 11   Entered and Authorized by:   Deatra Robinson MD   Signed by:   Deatra Robinson MD on 11/25/2009   Method used:   Faxed to ...       [facility] (retail)       [address]       [place], [place]  [address]       Ph: [phone]       Fax: [phone]   RxID:   [id number] SM INSULIN SYRINGE 31G X 5/16" 0.5 ML MISC (INSULIN SYRINGE-NEEDLE U-100) Use with Novolog 70/30 as instructed twice daily  #100 x 11   Entered and Authorized by:   Deatra Robinson MD   Signed by:   Deatra Robinson MD on 11/25/2009   Method used:   Faxed to ...       [facility] (retail)       [address]       [place], [place]  [address]       Ph: [phone]       Fax: [phone]   RxID:   [id number] LANCETS  MISC (LANCETS) Check blood glucose before breakfast once a day  #100  x 11   Entered and Authorized by:   Deatra Robinson MD   Signed by:   Deatra Robinson MD on 11/25/2009   Method used:   Faxed to ...       [facility] (retail)       [address]       [place], [place]  [address]       Ph: [phone]       Fax: [phone]    RxID:   [id number] NOVOLIN 70/30 70-30 % SUSP (INSULIN ISOPHANE & REGULAR) Inject 15 units subcutaneously in the morning and 20 units in the evening.  #100 x 11   Entered and Authorized by:   Deatra Robinson MD   Signed by:   Deatra Robinson MD on 11/25/2009   Method used:   Faxed to ...       [facility] (retail)       [address]       [place], [place]  [address]       Ph: [phone]       Fax: [phone]   RxID:   [id number] PRAVASTATIN SODIUM 40 MG TABS (PRAVASTATIN SODIUM) Take 1 tablet by mouth once a day  #30 x 11   Entered and Authorized by:   Deatra Robinson MD   Signed by:   Deatra Robinson MD on 11/25/2009   Method used:   Faxed to ...       [facility] (retail)       [address]       [place], [place]  [address]       Ph: [phone]       Fax: [phone]   RxID:   [id number] ASPIRIN 81 MG CHEW (ASPIRIN) Take 1 tablet by mouth once a day with meals  #30 x 11   Entered and Authorized by:   Deatra Robinson MD   Signed by:   Deatra Robinson MD on 11/25/2009   Method used:   Faxed to ...       [facility] (retail)       [address]       [place], [place]  [address]       Ph: [phone]       Fax: [phone]   RxID:   [id number] CVELFYBOF TEST  STRP (GLUCOSE BLOOD) use to check blood sugars three times a day  #90 x 11   Entered and Authorized by:   Deatra Robinson MD   Signed by:   Deatra Robinson MD on 11/25/2009   Method used:   Faxed to ...       [facility] (retail)       [address]       [place], [place]  [address]       Ph: [phone]       Fax: [phone]   RxID:   [id number] LANCETS  MISC (LANCETS) use to check blood sugar 3x daily before meals  #100 x 11   Entered and Authorized by:   Deatra Robinson MD   Signed by:   Deatra Robinson MD on 11/25/2009   Method  used:   Faxed to ...       [facility] (retail)       [address]       [place], [place]  [address]  Ph: [phone]       Fax: [phone]   RxID:   [id number]  Process Orders Check Orders Results:     [facility]: ABN not required for this insurance Tests Sent for requisitioning (November 25, 2009 2:51 PM):     11/25/2009: [facility] -- T-Comprehensive Metabolic Panel [[id number]] (signed)     11/25/2009: [facility] -- T-Lipid Profile [id number] (signed)    Laboratory Results   Blood Tests   Date/Time Received: November 25, 2009 2:36 PM Date/Time Reported: Alric Quan  November 25, 2009 2:37 PM   HGBA1C: 8.7%   (Normal Range: Non-Diabetic - 3-6%   Control Diabetic - 6-8%) CBG Random:: 215mg /dL

## 2010-06-30 NOTE — Progress Notes (Signed)
  Phone Note Outgoing Call   Summary of Call: Tel. mailbox full and not accepting messages/will send letter home.

## 2010-06-30 NOTE — Assessment & Plan Note (Signed)
Summary: RECK/KARIMOVA/VS   Vital Signs:  Patient profile:   45 year old male Height:      72.25 inches (183.52 cm) Weight:      205.1 pounds (89.55 kg) BMI:     27.72 Temp:     97.4 degrees F (36.33 degrees C) oral Pulse rate:   102 / minute BP sitting:   130 / 79  (right arm) Cuff size:   regular  Vitals Entered By: Theotis Barrio NT II (August 01, 2009 9:29 AM) CC: 3 MONTH OFFICE VISIT Is Patient Diabetic? Yes Did you bring your meter with you today? Yes Pain Assessment Patient in pain? no      Nutritional Status BMI of > 30 = obese CBG Result 251  Have you ever been in a relationship where you felt threatened, hurt or afraid?No   Does patient need assistance? Functional Status Self care Ambulation Normal Comments HERER FOR HIS 3 MONTH OFFICE VISIT   Primary Care Provider:  Deatra Robinson MD  CC:  3 MONTH OFFICE VISIT.  History of Present Illness: Terry Castillo is a 45 year old Male with DM, h/o a fib, alcohol abuse and tobacco abuse. In here for a follow up visit. CBG at home are in 135 - 150s. Doing fine and doesn't have any new complaints today.   Depression History:      The patient denies a depressed mood most of the day and a diminished interest in his usual daily activities.         Current Medications (verified): 1)  Novolin 70/30 70-30 % Susp (Insulin Isophane & Regular) .... Inject 15 Units Subcutaneously in The Morning and 20 Units in The Evening. 2)  Truetrack Test  Strp (Glucose Blood) .... Use To Check Blood Sugars Three Times A Day 3)  Lancets  Misc (Lancets) .... Use To Check Blood Sugar 3x Daily Before Meals 4)  Aspirin 81 Mg Chew (Aspirin) .... Take 1 Tablet By Mouth Once A Day With Meals  Allergies (verified): No Known Drug Allergies  Past History:  Past Medical History: Last updated: 04/06/2006 onychomycosis Diabetes mellitus, type II:  uncontrolled alcohol abuse tobacco abuse Atrial  fibrillation with RVR in 2006 with  spontaneous conversion  Family History: Last updated: 03/03/2009 mother --MI at age of 48 y/o, DM, HTN father --cancer (unknown type); at unknown age  Social History: Last updated: 03/03/2009 Current Smoker Alcohol use-quit in 2009. Illicit drugs --no  Dropped out of the 11th grade in high school used to work in [employer]. No family in [place] (2 sisters and a brother in [place]. [place])  Risk Factors: Alcohol Use: 0 (05/01/2009) Caffeine Use: 1 cup of cofee a day (05/01/2009) Exercise: no (05/01/2009)  Risk Factors: Smoking Status: quit (05/01/2009) Packs/Day: 0.5 (05/01/2009)  Review of Systems       as per HPI.   Physical Exam  General:  alert and well-developed.  alert and well-developed.   Lungs:  normal respiratory effort and normal breath sounds.   Heart:  normal rate and regular rhythm.  normal rate and regular rhythm.   Abdomen:  soft and non-tender.  soft and non-tender.   Pulses:  normal peripheral pulses  Extremities:  no cyanosis, clubbing or edema  Neurologic:  non focal.  Psych:  normally interactive.  normally interactive.     Impression & Recommendations:  Problem # 1:  DIABETES MELLITUS, TYPE II (ICD-250.00) Data reviewed: A1c: >14.0 (03/03/2009 2:36:49 PM)  MICROALB/CR: 18.4 (03/03/2009 8:50:00 PM) LDL:  200 (03/03/2009 8:50:00 PM) EYE: pending FOOT: yes (05/01/2009 8:53:18 AM) A1c to be done today: 8.8. I only got three readings on his CBG meter. I have asked him to check it at least twice a day and then will be able to make some adjustments to the insulin dose. If persistently higher than 130 or 180 (fasting and post meal), he will give [place] a call. Awaiting eye exam. Elevated LDL noted, will start him on statin.   His updated medication list for this problem includes:    Novolin 70/30 70-30 % Susp (Insulin isophane & regular) ..... Inject 15 units subcutaneously in the morning and 20 units in the evening.    Aspirin 81 Mg Chew  (Aspirin) .Marland Kitchen... Take 1 tablet by mouth once a day with meals  Orders: T- Capillary Blood Glucose ([id number]) T-Hgb A1C (in-house) ([id number])  Problem # 2:  HYPERLIPIDEMIA (ICD-272.4) Start statin, along with life style changes. Will get baseline LFT today.   Orders: T-Comprehensive Metabolic Panel [id number])  His updated medication list for this problem includes:    Pravastatin Sodium 40 Mg Tabs (Pravastatin sodium) .Marland Kitchen... Take 1 tablet by mouth once a day  Complete Medication List: 1)  Novolin 70/30 70-30 % Susp (Insulin isophane & regular) .... Inject 15 units subcutaneously in the morning and 20 units in the evening. 2)  Truetrack Test Strp (Glucose blood) .... Use to check blood sugars three times a day 3)  Lancets Misc (Lancets) .... Use to check blood sugar 3x daily before meals 4)  Aspirin 81 Mg Chew (Aspirin) .... Take 1 tablet by mouth once a day with meals 5)  Pravastatin Sodium 40 Mg Tabs (Pravastatin sodium) .... Take 1 tablet by mouth once a day  Patient Instructions: 1)  We will let you know if anything wrong with your lab work (noted your home number: [phone]). 2)  Please schedule a follow-up appointment in 2 months. 3)  A new medicine has been started for your high cholesterol.  Prescriptions: PRAVASTATIN SODIUM 40 MG TABS (PRAVASTATIN SODIUM) Take 1 tablet by mouth once a day  #30 x 5   Entered and Authorized by:   Zara Council MD   Signed by:   Zara Council MD on 08/01/2009   Method used:   Faxed to ...       [facility] (retail)       [address]       [place], [place]  [address]       Ph: [phone]       Fax: [phone]   RxID:   [id number]  Process Orders Check Orders Results:     [facility]: ABN not required for this insurance Tests Sent for requisitioning (August 01, 2009 1:05 PM):     08/01/2009: [facility] -- T-Comprehensive Metabolic Panel [phone]  (signed)    Prevention & Chronic Care Immunizations   Influenza vaccine: refuses  (03/29/2007)   Influenza vaccine deferral: Refused  (03/03/2009)    Tetanus booster: Not documented   Td booster deferral: Refused  (03/03/2009)    Pneumococcal vaccine: Not documented  Other Screening   PSA: Not documented   Smoking status: quit  (05/01/2009)  Diabetes Mellitus   HgbA1C: 8.8  (08/01/2009)   HgbA1C action/deferral: Ordered  (03/03/2009)   Hemoglobin A1C due: 06/03/2009    Eye exam: Not documented   Diabetic eye exam action/deferral: Ophthalmology referral  (03/03/2009)    Foot exam: yes  (  05/01/2009)   Foot exam action/deferral: Do today   High risk foot: Not documented   Foot care education: Done  (03/03/2009)    Urine microalbumin/creatinine ratio: 18.4  (03/03/2009)   Urine microalbumin action/deferral: Ordered   Urine microalbumin/cr due: 03/03/2010    Diabetes flowsheet reviewed?: Yes   Progress toward A1C goal: Unchanged  Lipids   Total Cholesterol: 285  (03/03/2009)   Lipid panel action/deferral: Lipid Panel ordered   LDL: 200  (03/03/2009)   LDL Direct: Not documented   HDL: 56  (03/03/2009)   Triglycerides: 145  (03/03/2009)   Lipid panel due: 09/01/2009    SGOT (AST): 10  (03/03/2009)   SGPT (ALT): 9  (03/03/2009) CMP ordered    Alkaline phosphatase: 97  (03/03/2009)   Total bilirubin: 0.8  (03/03/2009)    Lipid flowsheet reviewed?: Yes   Progress toward LDL goal: Unchanged  Self-Management Support :   Personal Goals (by the next clinic visit) :     Personal A1C goal: 8  (04/10/2009)     Personal blood pressure goal: 140/90  (04/10/2009)     Personal LDL goal: 100  (04/10/2009)    Patient will work on the following items until the next clinic visit to reach self-care goals:     Medications and monitoring: take my medicines every day, check my blood sugar, examine my feet every day  (08/01/2009)     Eating: drink diet soda or water instead of  juice or soda, eat more vegetables, use fresh or frozen vegetables, eat foods that are low in salt, eat baked foods instead of fried foods, limit or avoid alcohol  (08/01/2009)     Activity: take a 30 minute walk every day  (08/01/2009)     Other: drinking fruit juices often - diluting with extra ice to decrease sugar - unable to exercise during winter  (05/01/2009)    Diabetes self-management support: Written self-care plan  (04/10/2009)    Lipid self-management support: Written self-care plan  (04/10/2009)    Laboratory Results   Blood Tests   Date/Time Received: August 01, 2009 10:11 AM  Date/Time Reported: Burke Keels  August 01, 2009 10:12 AM   HGBA1C: 8.8%   (Normal Range: Non-Diabetic - 3-6%   Control Diabetic - 6-8%) CBG Random:: 251mg /dL

## 2010-06-30 NOTE — Progress Notes (Signed)
Summary: change insulin/ hla  Phone Note From Pharmacy   Summary of Call[facility] from novolin 70/30 to humulin 70/30, may they? Initial call taken by: Marin Roberts RN,  February 27, 2010 4:19 PM  Follow-up for Phone Call        yes. Follow-up by: Zoila Shutter MD,  March 02, 2010 2:49 PM

## 2010-06-30 NOTE — Letter (Signed)
Summary: Generic Letter  [facility]  [address]   [place], [place] [address]   Phone: [phone]  Fax: [phone]       04/09/2010  LADON VANDENBERGHE [address] [place], [place]  [address]  Dear Mr. FERRICK,  I tried reaching you by phone but was not successful.  Your doctor asked that I contact you and discuss possible resources that might be of help to you.  Please call me at [phone] at your convenience.    Thank you,     Dorothe Pea, LCSW Clinical Social Worker [facility]

## 2010-06-30 NOTE — Assessment & Plan Note (Signed)
Summary: EST-3 MONTH RECHECK/CH   Vital Signs:  Patient profile:   45 year old male Height:      72.25 inches (183.52 cm) Weight:      211.5 pounds (96.14 kg) Temp:     98.7 degrees F (37.06 degrees C) oral Pulse rate:   101 / minute BP sitting:   122 / 73  (left arm) Cuff size:   large  Vitals Entered By: Cynda Familia Duncan Dull) (March 31, 2010 3:10 PM) CC: f/u, recurrent HAs Is Patient Diabetic? Yes Did you bring your meter with you today? No Pain Assessment Patient in pain? no      Nutritional Status BMI of > 30 = obese CBG Result 198  Have you ever been in a relationship where you felt threatened, hurt or afraid?No   Does patient need assistance? Functional Status Self care Ambulation Normal   Diabetic Foot Exam Last Podiatry Exam Date: 02/10/2007 Foot Inspection Is there a history of a foot ulcer?              No Is there a foot ulcer now?              No Is there swelling or an abnormal foot shape?          No Are the toenails long?                No Are the toenails thick?                No Are the toenails ingrown?              No Is there heavy callous build-up?              No Is there pain in the calf muscle (Intermittent claudication) when walking?    NoIs there a claw toe deformity?              No Is there elevated skin temperature?            No Is there limited ankle dorsiflexion?            No Is there foot or ankle muscle weakness?            No  Diabetic Foot Care Education Patient educated on appropriate care of diabetic feet.  Pulse Check          Right Foot          Left Foot Posterior Tibial:        normal            normal Dorsalis Pedis:        normal            normal  High Risk Feet? Yes   10-g (5.07) Semmes-Weinstein Monofilament Test           Right Foot          Left Foot Visual Inspection               Test Control      normal         normal Site 1         normal         normal Site 2         normal          normal Site 3         normal         normal Site 4  normal         normal Site 5         normal         normal Site 6         normal         normal Site 7         normal         normal Site 8         normal         normal Site 9         normal         normal Site 10         normal         normal  Impression      normal         normal     Primary Care Provider:  Deatra Robinson MD  CC:  f/u and recurrent HAs.  History of Present Illness: Follow up on: 1. DM.  Patient denies any side-effects of the medications or hypoglycemic events. Adherent with his regimen. 2. HA. left sided, periorbital, sharpt, pulsatile, for 2 weeks. No nocturnal Sx; no dizziness, no speech or visual dieficits, no fever, chills, or weakness. Aura with mild photosensitivity; no N/V; no trauma. No personal or FMHx of migraines. Patient states that had been under a great deal of stress from being unable to find an employment; Px with paying his bills; lives with his friend.  Preventive Screening-Counseling & Management  Alcohol-Tobacco     Alcohol drinks/day: 0     Alcohol type: BEER     Smoking Status: quit     Packs/Day: 0.5     Year Started: 1990     Year Quit: 2010  Current Problems (verified): 1)  Migraine, Common  (ICD-346.10) 2)  Inadequate Material Resources  (ICD-V60.2) 3)  Inadequate Material Resources  (ICD-V60.2) 4)  Hyperlipidemia  (ICD-272.4) 5)  Onychomycosis, Toenails  (ICD-110.1) 6)  Tobacco Abuse  (ICD-305.1) 7)  Alcohol Abuse  (ICD-305.00) 8)  Diabetes Mellitus, Type II  (ICD-250.00) 9)  Atrial Fibrillation  (ICD-427.31)  Allergies (verified): No Known Drug Allergies  Past History:  Past medical, surgical, family and social histories (including risk factors) reviewed for relevance to current acute and chronic problems.  Past Medical History: Reviewed history from 04/06/2006 and no changes required. onychomycosis Diabetes mellitus, type II:  uncontrolled alcohol  abuse tobacco abuse Atrial  fibrillation with RVR in 2006 with spontaneous conversion  Family History: Reviewed history from 03/03/2009 and no changes required. mother --MI at age of 56 y/o, DM, HTN father --cancer (unknown type); at unknown age  Social History: Reviewed history from 03/03/2009 and no changes required. Current Smoker Alcohol use-quit in 2009. Illicit drugs --no  Dropped out of the 11th grade in high school used to work in [employer]. No family in [place] (2 sisters and a brother in [place])  Review of Systems  The patient denies anorexia, fever, weight loss, weight gain, vision loss, decreased hearing, hoarseness, chest pain, syncope, dyspnea on exertion, peripheral edema, prolonged cough, headaches, hemoptysis, abdominal pain, melena, hematochezia, severe indigestion/heartburn, hematuria, incontinence, muscle weakness, transient blindness, depression, and angioedema.    Physical Exam  General:  alert and well-developed.  alert and well-developed.   Head:  no abnormalities observed.  no abnormalities observed.   Eyes:  vision grossly intact by Snellen chart. EOMI bilaterally; no icterus bialterally. Ears:  no external deformities.   Nose:  no external erythema and no nasal discharge.  no external erythema and no nasal discharge.   Mouth:  pharynx pink and moist, no erythema, no exudates, no postnasal drip, no lesions, and no tongue abnormalities.  pharynx pink and moist, no erythema, no exudates, no postnasal drip, no lesions, and no tongue abnormalities.   Neck:  supple, full ROM, and no masses.  supple, full ROM, and no masses.   Lungs:  normal respiratory effort and normal breath sounds.   Heart:  normal rate and regular rhythm.  normal rate and regular rhythm.   Abdomen:  soft and non-tender.  soft and non-tender.   Msk:  normal ROM, no joint tenderness, and no joint swelling.   Pulses:  normal peripheral pulses  Extremities:  no cyanosis,  clubbing or edema  Neurologic:  alert & oriented X3, cranial nerves II-XII intact, strength normal in all extremities, sensation intact to light touch, sensation intact to pinprick, gait normal, DTRs symmetrical and normal, finger-to-nose normal, and heel-to-shin normal.   Skin:  turgor normal, color normal, no rashes, and no suspicious lesions.  Psych:  normally interactive.   Oriented X3, memory intact for recent and remote, good eye contact, not anxious appearing, and not depressed appearing.    Diabetes Management Exam:    Foot Exam (with socks and/or shoes not present):       Sensory-Monofilament:          Left foot: normal          Right foot: normal   Impression & Recommendations:  Problem # 1:  DIABETES MELLITUS, TYPE II (ICD-250.00) Assessment Improved  His updated medication list for this problem includes:    Novolin 70/30 70-30 % Susp (Insulin isophane & regular) ..... Inject 25 units subcutaneously in the morning and 25 units in the evening.    Aspirin 81 Mg Chew (Aspirin) .Marland Kitchen... Take 1 tablet by mouth once a day with meals  Orders: T- Capillary Blood Glucose (57846) T-Hgb A1C (in-house) (96295MW) T-Urine Microalbumin w/creat. ratio [phone])  Labs Reviewed: Creat: 0.99 (12/03/2009)   Microalbumin: 0.70 (10/27/2005) Reviewed HgBA1c results: 8.7 (11/25/2009)  8.8 (08/01/2009)  Problem # 2:  HYPERLIPIDEMIA (ICD-272.4) Assessment: Unchanged REviewed low cholesterol, high fiber diet. CV exercise discussed. His updated medication list for this problem includes:    Pravastatin Sodium 40 Mg Tabs (Pravastatin sodium) .Marland Kitchen... Take 1 tablet by mouth once a day  Labs Reviewed: SGOT: 12 (12/03/2009)   SGPT: 11 (12/03/2009)  Lipid Goals: Chol Goal: 200 (09/22/2009)   HDL Goal: 40 (09/22/2009)   LDL Goal: 100 (09/22/2009)   TG Goal: 150 (09/22/2009)  Prior 10 Yr Risk Heart Disease: 11 % (09/22/2009)   HDL:43 (12/03/2009), 56 (03/03/2009)  LDL:109 (12/03/2009), 200  (03/03/2009)  Chol:168 (12/03/2009), 285 (03/03/2009)  Trig:82 (12/03/2009), 145 (03/03/2009)  Problem # 3:  MIGRAINE, COMMON (ICD-346.10) Assessment: New Likely, stress-induced. No Red flags to suggest intracranial abnormality. Instructed to keep a HA diary. Instructed to call 911 or go to ED uif Sx worsen. His updated medication list for this problem includes:    Aspirin 81 Mg Chew (Aspirin) .Marland Kitchen... Take 1 tablet by mouth once a day with meals    Naproxen 375 Mg Tabs (Naproxen) .Marland Kitchen... Take one tablet by mouth two times a day as needed with meals for headache  Headache diary reviewed.  Complete Medication List: 1)  Novolin 70/30 70-30 % Susp (Insulin isophane & regular) .... Inject 25 units subcutaneously in the morning and 25 units in the evening. 2)  Truetrack Test Strp (Glucose blood) .... Use to check blood sugars three times a day 3)  Lancets Misc (Lancets) .... Use to check blood sugar 3x daily before meals 4)  Aspirin 81 Mg Chew (Aspirin) .... Take 1 tablet by mouth once a day with meals 5)  Pravastatin Sodium 40 Mg Tabs (Pravastatin sodium) .... Take 1 tablet by mouth once a day 6)  Lancets Misc (Lancets) .... Check blood glucose before breakfast once a day 7)  Sm Insulin Syringe 31g X 5/16" 0.5 Ml Misc (Insulin syringe-needle u-100) .... Use with novolog 70/30 as instructed twice daily 8)  Naproxen 375 Mg Tabs (Naproxen) .... Take one tablet by mouth two times a day as needed with meals for headache  Other Orders: Social Work Referral (Social )  Patient Instructions: 1)  Please, pick up your new prescription at the pharmacy. 2)  Call with any questions. 3)  Please, return if feeling worse or no improvement. 4)  Follow up in 3 months or sooner. Prescriptions: NAPROXEN 375 MG TABS (NAPROXEN) Take one tablet by mouth two times a day as needed with meals for headache  #60 x 3   Entered and Authorized by:   Deatra Robinson MD   Signed by:   Deatra Robinson MD on 03/31/2010   Method  used:   Faxed to ...       [facility] (retail)       [address]       [place], [place]  [address]       Ph: [phone]       Fax: [phone]   RxID:   [id number]   Handout requested.    Orders Added: 1)  T- Capillary Blood Glucose [82948] 2)  T-Hgb A1C (in-house) [84132GM] 3)  Social Work Referral [place] ] 4)  Est. Patient Level III [01027] 5)  T-Urine Microalbumin w/creat. ratio [[id number]]    Prevention & Chronic Care Immunizations   Influenza vaccine: refuses  (03/29/2007)   Influenza vaccine deferral: Refused  (03/03/2009)    Tetanus booster: Not documented   Td booster deferral: Refused  (03/03/2009)    Pneumococcal vaccine: Not documented   Pneumococcal vaccine deferral: Refused  (03/31/2010)  Other Screening   PSA: Not documented   PSA action/deferral: Discussed-decision deferred  (03/31/2010)   Smoking status: quit  (03/31/2010)  Diabetes Mellitus   HgbA1C: 8.0  (03/31/2010)   HgbA1C action/deferral: Ordered  (03/03/2009)   Hemoglobin A1C due: 07/01/2010    Eye exam: Not documented   Diabetic eye exam action/deferral: Ophthalmology referral  (03/03/2009)    Foot exam: yes  (03/31/2010)   Foot exam action/deferral: Do today   High risk foot: Yes  (03/31/2010)   Foot care education: Done  (03/31/2010)    Urine microalbumin/creatinine ratio: 18.4  (03/03/2009)   Urine microalbumin action/deferral: Ordered   Urine microalbumin/cr due: 04/01/2011    Diabetes flowsheet reviewed?: Yes   Progress toward A1C goal: Unchanged  Lipids   Total Cholesterol: 168  (12/03/2009)   Lipid panel action/deferral: Lipid Panel ordered   LDL: 109  (12/03/2009)   LDL Direct: Not documented   HDL: 43  (12/03/2009)   Triglycerides: 82  (12/03/2009)   Lipid panel due: 12/04/2010    SGOT (AST): 12  (12/03/2009)   SGPT (ALT): 11  (12/03/2009)   Alkaline phosphatase: 68  (12/03/2009)   Total bilirubin: 0.7   (12/03/2009)   Liver panel due: 12/04/2010    Lipid flowsheet reviewed?: Yes  Progress toward LDL goal: Unchanged    Stage of readiness to change (lipid management): Maintenance  Self-Management Support :   Personal Goals (by the next clinic visit) :     Personal A1C goal: 8  (04/10/2009)     Personal blood pressure goal: 140/90  (04/10/2009)     Personal LDL goal: 100  (04/10/2009)    Patient will work on the following items until the next clinic visit to reach self-care goals:     Medications and monitoring: take my medicines every day, examine my feet every day  (03/31/2010)     Eating: drink diet soda or water instead of juice or soda, eat foods that are low in salt, eat baked foods instead of fried foods  (03/31/2010)     Activity: join a walking program  (03/31/2010)     Other: bring meter every visit  (09/22/2009)    Diabetes self-management support: Pre-printed educational material, Resources for patients handout, Written self-care plan  (09/22/2009)    Lipid self-management support: Pre-printed educational material, Resources for patients handout, Written self-care plan  (09/22/2009)    Nursing Instructions: Diabetic foot exam today   Process Orders Check Orders Results:     [facility]: ABN not required for this insurance Tests Sent for requisitioning (April 03, 2010 5:40 PM):     03/31/2010: [facility] -- T-Urine Microalbumin w/creat. ratio [[id number]] (signed)     Laboratory Results   Blood Tests   Date/Time Received: March 31, 2010 3:31 PM Date/Time Reported: Burke Keels  March 31, 2010 3:31 PM   HGBA1C: 8.0%   (Normal Range: Non-Diabetic - 3-6%   Control Diabetic - 6-8%) CBG Random:: 198mg /dL

## 2010-06-30 NOTE — Progress Notes (Signed)
Summary: Medication change  Phone Note Refill Request Message from:  Fax from Pharmacy on April 03, 2010 10:33 AM  Refills Requested: Medication #1:  NAPROXEN 375 MG TABS Take one tablet by mouth two times a day as needed with meals for headache. Pharmacy only has 500 mg.  Can the Naproxen be changed.    Method Requested: Electronic Initial call taken by: Angelina Ok RN,  April 03, 2010 10:34 AM  Follow-up for Phone Call        Refill approved-nurse to complete. Follow-up by: Deatra Robinson MD,  April 03, 2010 12:09 PM

## 2010-07-14 ENCOUNTER — Encounter: Payer: Self-pay | Admitting: Internal Medicine

## 2010-08-11 ENCOUNTER — Ambulatory Visit (INDEPENDENT_AMBULATORY_CARE_PROVIDER_SITE_OTHER): Payer: Self-pay | Admitting: Internal Medicine

## 2010-08-11 DIAGNOSIS — I10 Essential (primary) hypertension: Secondary | ICD-10-CM

## 2010-08-11 NOTE — Progress Notes (Signed)
  Subjective:    Patient ID: Terry Castillo, male    DOB: December 24, 1965, 45 y.o.   MRN: [id number]  HPI    Review of Systems     Objective:   Physical Exam        Assessment & Plan:

## 2010-08-12 LAB — GLUCOSE, CAPILLARY: Glucose-Capillary: 198 mg/dL — ABNORMAL HIGH (ref 70–99)

## 2010-08-17 LAB — GLUCOSE, CAPILLARY: Glucose-Capillary: 215 mg/dL — ABNORMAL HIGH (ref 70–99)

## 2010-09-02 LAB — GLUCOSE, CAPILLARY: Glucose-Capillary: 214 mg/dL — ABNORMAL HIGH (ref 70–99)

## 2010-09-04 LAB — GLUCOSE, CAPILLARY: Glucose-Capillary: 316 mg/dL — ABNORMAL HIGH (ref 70–99)

## 2010-09-04 NOTE — Progress Notes (Signed)
  Subjective:    Patient ID: Terry Castillo, male    DOB: [birth year], 45 y.o.   MRN: [id number]  HPI Patient did not show for his appointment.   Review of Systems     Objective:   Physical Exam        Assessment & Plan:

## 2010-09-23 ENCOUNTER — Encounter: Payer: Self-pay | Admitting: Internal Medicine

## 2010-10-16 NOTE — Consult Note (Signed)
NAME:  Terry Castillo, Terry Castillo NO.:  [id number]   MEDICAL RECORD NO.:  [id number]          PATIENT TYPE:  INP   LOCATION:  2014                         FACILITY:  [facility]   PHYSICIAN:  Jonelle Sidle, M.D. LHCDATE OF BIRTH:  26-Dec-1965   DATE OF PROCEDURE:  03/09/2005  DATE OF DISCHARGE:                      STAT - MUST CHANGE TO CORRECT WORK TYPE   REASON FOR CONSULTATION:  New onset atrial fibrillation.   HISTORY OF PRESENT ILLNESS:  [name] is a 45 year old male with a  reported history of type 2 diabetes mellitus diagnosed approximately 5-6  years ago with medication noncompliance and use of no specific medicines  over the last few months.  Additional history includes ongoing tobacco use  of half pack per day, regular alcohol use of approximately 40 ounces beer  each day, and history of other substance abuse including remotely cocaine,  although recent urine drug screen is negative for this, as well as  reportedly marijuana.  Terry Castillo is now admitted to the hospital having  presented stating that he woke up at 12 noon on the night of October 8  feeling that his heart rate was very rapid.  This persisted throughout the  day and ultimately became associated with dizziness although he never  experienced any frank chest pian, shortness of breath, or syncope.  He was  diagnosed with atrial fibrillation with rapid ventricular response which was  confirmed by electrocardiogram on October 9.  Nonspecific inferolateral ST-T  wave changes were noted on these tracings.  Initial cardiac markers have  been normal and the patient has already been placed on a Diltiazem drip with  control of heart rate from initially the 130s down now into the 70s.  He is  also on a heparin drip.  We have been asked to evaluate the patient for  potential elective cardioversion particularly given the recent onset of  symptoms which, at this point, is now about 24 hours ago.   I have discussed  the situation with the patient today.  He tells me he has  never experienced any similar symptoms in the past and is fairly clear on  the onset of these symptoms recently.  He has no previous history of stroke,  hypertension, congestive heart failure, thyroid disease, and, in fact during  this admission, has a normal TSH of 0.85 and normal T4 of 1.22.  2D  echocardiography is pending at this time.  I discussed compliance issues  with Mr. Monforte and he indicates that he would have difficulty following up  with a physician regularly stating that he is busy.  I reviewed the  adverse cardiac events associated with atrial fibrillation as well as his  other lifestyle and discussed the importance of making some changes, if he  is going to make an impact on his overall prognosis.  I discussed potential  elective cardioversion with the patient including its potential risks and  benefits.  We have tentatively decided to schedule this for tomorrow morning  assuming that the remainder of his cardiac workup is reassuring.   ALLERGIES:  No known drug allergies.  CURRENT MEDICATIONS:  Heparin drip, Cardizem drip, Protonix 40 mg IV daily,  Tylenol p.r.n., and NovoLog as directed.   PAST MEDICAL HISTORY:  1.  Type 2 diabetes mellitus, apparently diagnosed 5-6 years ago, and      apparently taking Metformin although he has been out of all medicines      for the last few months.  2.  No previously documented history of hypertension, congestive heart      failure, coronary artery disease, myocardial infarction, thyroid      disease, or cardiac dysrhythmia.   SOCIAL HISTORY:  The patient lives in [place].  He is separated and  apparently released from prison approximately 3 1/2 years ago.  He has a  history of poly-substance abuse including cocaine in the past as well as  marijuana and active regular alcohol and tobacco use.  He has done some work  in [employer].   FAMILY HISTORY:   Significant for hypertension, diabetes, cardiac and  cerebrovascular disease in the patient's mother who is living at age 18.  The patient's father died at an uncertain age, potentially with a history of  either cancer or heart disease.  He also has one brother in his 23s with  type 2 diabetes mellitus.   REVIEW OF SYSTEMS:  As described in the history of present illness.  He has,  in general, felt somewhat fatigued.  He has only recently had a sense of  rapid heart rate as discussed above.  He has had no chest pain or  progressive shortness of breath with activity per my discussion with him.  He has had no syncope, bleeding problems, seizures, focal weakness, visual  changes.   PHYSICAL EXAMINATION:  VITAL SIGNS:  Temperature 98.2, heart rate 82, respirations 20, blood  pressure 137/91, oxygen saturation 99% on room air, weight 181.7 pounds.  GENERAL:  This is a normally nourished male lying in bed in no acute  distress, denying any active symptoms.  Examination of the neck reveals no  elevated jugular venous pressure, no carotid bruits, no thyromegaly or  thyroid tenderness is noted.  LUNGS:  Clear to auscultation bilaterally without labored breathing at rest.  HEART:  Irregularly irregular rhythm without S3 gallop.  There is a soft  basal systolic murmur with preserved S2 and no radiation, there is no  pericardial rub.  ABDOMEN:  Soft, no hepatomegaly or bruits.  EXTREMITIES:  No pitting edema.  Peripheral pulses are 1-2+.   LABORATORY DATA:  WBCs 7.7, hemoglobin 12.8, platelets 134, INR 1.2, sodium  139, potassium 3.9, chloride 109, bicarb 24, glucose 147, BUN 7, creatinine  1. Liver function tests are within normal limits.  Initial CK 123, CK MB  0.9, troponin I 0.02.  Urine drug screen negative.  HIV nonreactive.  Alcohol level less than 5.  RPR nonreactive.   No chest x-ray is available.   IMPRESSION: 1.  New onset and newly diagnosed atrial fibrillation with symptoms  onset      approximately 24 hours ago at this particular time.  Heart rate is      better controlled on Diltiazem and the patient is also on a heparin      drip.  He has a history of polysubstance abuse, although it seems no      recent cocaine or marijuana use based on urine drug screen.  He does not      drink alcohol regularly and at this time, an echocardiogram is pending  to exclude cardiomyopathy.  There is no other clear history of      hypertension, congestive heart failure, valvular heart disease, ischemic      heart disease, and recent thyroid studies are within normal limits.  A      full set of cardiac markers are pending.  2.  Diabetes mellitus complicated by noncompliance.  The patient has been      off medicines, specifically Metformin, for the last few months.   RECOMMENDATIONS:  1.  I discussed the situation with the patient as reviewed in the history of      present illness.  At this point, would suggest adding enteric coated      aspirin 325 mg p.o. daily to the present regimen and continue to follow      up on cardiac markers as well as echocardiographic results.  If this      workup is reassuring, then we can tentatively schedule a cardioversion      for tomorrow.  I did discuss this with the patient today and, at this      point, he is in agreement to proceed.  2.  Given the patient's history of type 2 diabetes mellitus, this does raise      the possibility of considering Coumadin in this patient, particularly if      he continues to have recurrences.  Complicating this, obviously, is his      noncompliance.  I discussed this with him today and he frankly tells me      that he feels that he would have difficulty following up regularly with      a doctor and, therefore, may not be the best Coumadin candidate at this      time.  It would likely be unwise to start this gentleman on Coumadin      until he demonstrates adequate follow up.  In this case, full dose       aspirin would be our best bet and given the fact that he has had recent      onset atrial fibrillation, should be able to be cardioverted,      particularly if it is within 48 hours on heparin with a planned      transition to full dose aspirin alone.  3.  Obviously lifestyle modifications would be in this gentleman's best      interest.  I discussed this today.  He is at risk for adverse events,      particularly from a cardiac perspective increased.  He would clearly      benefit from regular follow up with a primary care physician that might      help him address some of these issues long term.  4.  Will follow with you.           ______________________________  Jonelle Sidle, M.D. [facility]     SGM/MEDQ  D:  03/09/2005  T:  03/09/2005  Job:  [id number]   cc:   Alvester Morin, M.D.  Fax: [phone]

## 2010-10-16 NOTE — Discharge Summary (Signed)
NAME:  Terry Castillo, Terry Castillo NO.:  [id number]   MEDICAL RECORD NO.:  [id number]          PATIENT TYPE:  INP   LOCATION:  2014                         FACILITY:  [facility]   PHYSICIAN:  Alvester Morin, M.D.  DATE OF BIRTH:  1966/01/28   DATE OF ADMISSION:  03/08/2005  DATE OF DISCHARGE:  03/10/2005                                 DISCHARGE SUMMARY   DISCHARGE DIAGNOSIS:  1.  New onset atrial fibrillation with rapid ventricular response, now      converted.  2.  Diabetes type 2.  3.  Poly-substance abuse.   DISCHARGE MEDICATIONS:  1.  Metformin 500 mg p.o. b.i.d.  2.  Aspirin 325 mg p.o. daily.   CONDITION ON DISCHARGE:  Stable in normal sinus rhythm.   DISCHARGE INSTRUCTIONS:  To follow up with Dr. Donald Pore on March 23, 2005.   PROCEDURES:  Chest x-ray March 08, 2005, no acute findings.  2D  echocardiogram March 09, 2005, overall left ventricular systolic function  was normal, LVEF 55-65%, no left ventricular wall motion abnormalities  identified.  Left ventricular wall thickness upper limits of normal.   CONSULTATIONS:  [facility], Dr. Diona Browner and Dr. Dietrich Pates.   BRIEF ADMITTING HISTORY AND PHYSICAL:  [name] is a 45 year old African  American male with past medical history of type 2 diabetes mellitus and  history of poly-substance abuse who presented to the emergency department  with complaints of his heart racing, he states symptoms began around 12 p.m.  when he woke up with these symptoms.  He also described associated light  headedness and some shortness of breath.  The palpitations have been  continuous since onset, the patient denies chest pain, syncope, orthopnea,  PND, edema, fever, chills, vision changes, nausea, vomiting, or other  associated symptoms.  He has never had prior similar episodes.  He does have  significant cardiac risk factors including diabetes mellitus, family  history, tobacco and poly-substance abuse, distant  cocaine use.   On exam, vital signs revealed temperature 97, blood pressure 126/73, heart  rate 117, respiratory rate 24, oxygen saturation 99% on room air.  In  general, no acute distress, the patient is somewhat noncooperative.  HEENT:  Mucous membranes moist, pupils equal, round, reactive to light.  Lungs are  clear bilaterally.  Cardiovascular:  Irregularly irregular without murmurs,  gallops, and rubs.  Abdomen benign.  Extremities:  No edema, good peripheral  pulses.  Skin:  Multiple tattoos, otherwise, warm and dry.  Neurological  exam nonfocal.   ADMISSION LABORATORY DATA:  Alcohol less than 10.  Urinalysis normal.  TSH  0.85.  Sodium 136, potassium 3.8, chloride 111, bicarb 23, BUN 10,  creatinine 0.9, alkaline phos 75, AST 17, ALT 14, total protein 5.7, albumin  3.5, calcium 8.2, glucose 146.  Hemoglobin A1C 9.9.  White count 7.7,  hemoglobin 12.8, platelets 134.   HOSPITAL COURSE:  Problem 1:  Atrial fibrillation with RVR.  The patient was started on  Diltiazem drip for rate control of the atrial fibrillation, this was  successful and the patient remained rate controlled  throughout the hospital  stay.  He ruled out for acute MI by cardiac enzymes.  He was also  anticoagulated with a heparin drip.  The etiology of the atrial fibrillation  is unclear at this time but thought to be most likely secondary to either a  cardiomyopathy induced by alcohol or cocaine use or idiopathic.  A 2D echo  was performed which showed normal left ventricular ejection fraction with no  wall motion abnormalities and no valvular pathology.  A TSH was normal  ruling out  hypothyroidism as a cause.  Cardiology consult was obtained to  assess for elective cardioversion, however, it was felt that due to the  potential unreliability of his history including the onset of symptoms that  cardioversion would not be the optimal choice of management.  He did, in  fact, spontaneously convert to normal sinus  rhythm around 10 a.m. on the  morning of discharge.  The patient had been asymptomatic throughout his  admission.  He was started on full strength aspirin and instructed that  these symptoms may recur.  He does not require any specific cardiology  follow up at this time unless he does have recurrence of the arrhythmia.   Problem 2:  Diabetes mellitus.  The patient reports being out of his  Metformin for the past three months which is consistent with a hemoglobin  A1C of 9.9.  As Metformin is not generally a once a day drug, I have  increased his Metformin dose to 500 b.i.d. and provided him with samples  from the clinic until his next follow up with Dr. Donald Pore on October 24  at which time his diabetic management can be re-evaluated.   Problem 3:  Primary cardiac prevention.  He received counseling to stop  smoking during his hospital stay, fasting lipid panel was obtained which was  normal.  He is started on an aspirin a day.   DISCHARGE LABS AND VITAL SIGNS:  Sodium 137, potassium 3.9, chloride 106,  bicarb 23, BUN 15, creatinine 1.1, calcium 8.7, glucose 231, white count  8.1, hemoglobin 13.3, platelets 148, total cholesterol 149, triglycerides  59, HDL 51, LDL 86.  Vital signs at discharge are temperature 97.6, pulse  65, respirations 20, blood pressure 104/65, oxygen saturation 100% on room  air.      Clent Demark, M.D.      Alvester Morin, M.D.  Electronically Signed    LG/MEDQ  D:  03/10/2005  T:  03/10/2005  Job:  [id number]   cc:   Donald Pore, MD  Fax: [phone]   Jonelle Sidle, M.D. [facility]  [address]  [place]  [place] [address]   [place] Bing, M.D. [facility]  [address]  [address]  [place]  [place] [address]

## 2010-10-27 ENCOUNTER — Emergency Department (HOSPITAL_COMMUNITY): Payer: Self-pay

## 2010-10-27 ENCOUNTER — Emergency Department (HOSPITAL_COMMUNITY)
Admission: EM | Admit: 2010-10-27 | Discharge: 2010-10-27 | Disposition: A | Discharge status: Home / Self Care | Payer: Self-pay | Attending: Emergency Medicine | Admitting: Emergency Medicine

## 2010-10-27 DIAGNOSIS — Z794 Long term (current) use of insulin: Secondary | ICD-10-CM | POA: Insufficient documentation

## 2010-10-27 DIAGNOSIS — M25519 Pain in unspecified shoulder: Secondary | ICD-10-CM | POA: Insufficient documentation

## 2010-10-27 DIAGNOSIS — E119 Type 2 diabetes mellitus without complications: Secondary | ICD-10-CM | POA: Insufficient documentation

## 2010-12-14 ENCOUNTER — Other Ambulatory Visit: Payer: Self-pay | Admitting: *Deleted

## 2010-12-14 MED ORDER — "INSULIN SYRINGE-NEEDLE U-100 31G X 5/16"" 0.5 ML MISC": 1.0000 | Freq: Two times a day (BID) | Status: DC

## 2010-12-14 MED ORDER — INSULIN NPH ISOPHANE & REGULAR (70-30) 100 UNIT/ML ~~LOC~~ SUSP: SUBCUTANEOUS | Status: DC

## 2010-12-17 NOTE — Telephone Encounter (Signed)
Faxed to the [facility]. 

## 2011-03-02 LAB — BASIC METABOLIC PANEL
BUN: 13
Calcium: 8.8
Chloride: 106
Creatinine, Ser: 0.98
GFR calc Af Amer: >60
GFR calc non Af Amer: >60

## 2011-03-02 LAB — GLUCOSE, CAPILLARY: Glucose-Capillary: 318 — ABNORMAL HIGH

## 2011-05-11 ENCOUNTER — Ambulatory Visit (INDEPENDENT_AMBULATORY_CARE_PROVIDER_SITE_OTHER): Payer: Self-pay | Admitting: Internal Medicine

## 2011-05-11 ENCOUNTER — Encounter: Payer: Self-pay | Admitting: Internal Medicine

## 2011-05-11 DIAGNOSIS — Y639 Failure in dosage during unspecified surgical and medical care: Secondary | ICD-10-CM

## 2011-05-11 DIAGNOSIS — E119 Type 2 diabetes mellitus without complications: Secondary | ICD-10-CM

## 2011-05-11 DIAGNOSIS — Z91120 Patient's intentional underdosing of medication regimen due to financial hardship: Secondary | ICD-10-CM

## 2011-05-11 DIAGNOSIS — B351 Tinea unguium: Secondary | ICD-10-CM

## 2011-05-11 LAB — GLUCOSE, CAPILLARY: Glucose-Capillary: 274 mg/dL — ABNORMAL HIGH (ref 70–99)

## 2011-05-11 LAB — POCT GLYCOSYLATED HEMOGLOBIN (HGB A1C): Hemoglobin A1C: 9.1

## 2011-05-11 MED ORDER — TERBINAFINE HCL 250 MG PO TABS: 250.0000 mg | ORAL_TABLET | Freq: Every day | ORAL | Status: AC

## 2011-05-11 NOTE — Progress Notes (Deleted)
  Subjective:    Patient ID: Terry Castillo, male    DOB: 01-09-1966, 45 y.o.   MRN: [id number]  HPI    Review of Systems     Objective:   Physical Exam        Assessment & Plan:

## 2011-05-11 NOTE — Patient Instructions (Signed)
Please, make an appointment for your FASTING blood work at [facility]. Please, call Ms. Georges Mouse (Child psychotherapist ) for an appointment. Please, return to clinic in 8 weeks or sooner and call with  Any questions. Please, let us know if you change your mind and decide to have your vaccinations. Happy New Year!!!!

## 2011-05-11 NOTE — Progress Notes (Signed)
Subjective:   Patient ID: Terry Castillo male   DOB: 1965/07/29 45 y.o.   MRN: [id number]  HPI: [name] is a 45 y.o. man is here "for a follow up." continues to being unable to find an employment. Lives with his friend. Takes his insulin "when has it." patient never followed up with Georges Mouse (SW). Reports multiple toe nail pain. Denies any trauma or any other concerns.    Past Medical History  Diagnosis Date  . Onychomycosis   . Diabetes mellitus     uncontrolled  . Alcohol abuse   . Tobacco abuse   . Atrial fibrillation with RVR 2006    spontaneous conversion to sinus rhythm   Current Outpatient Prescriptions  Medication Sig Dispense Refill  . aspirin 81 MG chewable tablet Chew 81 mg by mouth daily.        Marland Kitchen glucose blood (TRUETRACK TEST) test strip Use to check blood sugars 3 times a day       . insulin NPH-insulin regular (NOVOLIN 70/30) (70-30) 100 UNIT/ML injection Inject 20 units SQ in the morning and 25 units SQ in the evening.  20 mL  11  . Insulin Syringe-Needle U-100 (SM INSULIN SYRINGE) 31G X 5/16" 0.5 ML MISC Inject 1 Syringe into the skin 2 (two) times daily. Use with Novolog 7030 as instructed 2(twice) daiy  100 each  11  . Lancet Devices MISC Use to check blood sugar 3 times a day       . naproxen (NAPROSYN) 375 MG tablet Take 375 mg by mouth 2 (two) times daily as needed. With meals;  For headaches       . pravastatin (PRAVACHOL) 40 MG tablet Take 40 mg by mouth daily.        Marland Kitchen terbinafine (LAMISIL) 250 MG tablet Take 1 tablet (250 mg total) by mouth daily.  42 tablet  0   Family History  Problem Relation Age of Onset  . Heart disease Mother 71    Myocardial infarction  . Diabetes Mother   . Hypertension Mother   . Cancer Father    History   Social History  . Marital Status: Legally Separated    Spouse Name: N/A    Number of Children: N/A  . Years of Education: N/A   Social History Main Topics  . Smoking status: Former Games developer  .  Smokeless tobacco: None   Comment: quit x 5months  . Alcohol Use: No     Quit in 2009  . Drug Use: No  . Sexually Active: None   Other Topics Concern  . None   Social History Narrative   Financial assistance approved for 100% discount at [facility] and has [facility] card per Rudell Cobb October 3,2011 1:56PMDropped out of 11th grade in high school.Used to work in [employer].No family in [place] (2 sisters and a brother in [place])   Review of Systems: Constitutional: Denies fever, chills, diaphoresis, appetite change and fatigue.  HEENT: Denies photophobia, eye pain, redness, hearing loss, ear pain, congestion, sore throat, rhinorrhea, sneezing, mouth sores, trouble swallowing, neck pain, neck stiffness and tinnitus.   Respiratory: Denies SOB, DOE, cough, chest tightness,  and wheezing.   Cardiovascular: Denies chest pain, palpitations and leg swelling.  Gastrointestinal: Denies nausea, vomiting, abdominal pain, diarrhea, constipation, blood in stool and abdominal distention.  Genitourinary: Denies dysuria, urgency, frequency, hematuria, flank pain and difficulty urinating.  Musculoskeletal: Denies myalgias, back pain, joint swelling, arthralgias and gait problem.  Skin: Denies pallor, rash  and wound.  Neurological: Denies dizziness, seizures, syncope, weakness, light-headedness, numbness and headaches.  Hematological: Denies adenopathy. Easy bruising, personal or family bleeding history  Psychiatric/Behavioral: Denies suicidal ideation, mood changes, confusion, nervousness, sleep disturbance and agitation  Objective:  Physical Exam: Filed Vitals:   05/11/11 1328  BP: 145/72  Pulse: 93  Temp: 97.9 F (36.6 C)  TempSrc: Oral  Height: 6\' 2"  (1.88 m)  Weight: 216 lb 3.2 oz (98.068 kg)   Constitutional: Vital signs reviewed.  Patient is a well-developed and well-nourished man in no acute distress and cooperative with exam. Alert and oriented x3.  Head: Normocephalic and  atraumatic Ear: TM normal bilaterally Mouth: no erythema or exudates, MMM Eyes: PERRL, EOMI, conjunctivae normal, No scleral icterus.  Neck: Supple, Trachea midline normal ROM, No JVD, mass, thyromegaly, or carotid bruit present.  Cardiovascular: RRR, S1 normal, S2 normal, no MRG, pulses symmetric and intact bilaterally Pulmonary/Chest: CTAB, no wheezes, rales, or rhonchi Abdominal: Soft. Non-tender, non-distended, bowel sounds are normal, no masses, organomegaly, or guarding present.  GU: no CVA tenderness Musculoskeletal: No joint deformities, erythema, or stiffness, ROM full and no nontender Hematology: no cervical, inginal, or axillary adenopathy.  Neurological: A&O x3, Strenght is normal and symmetric bilaterally, cranial nerve II-XII are grossly intact, no focal motor deficit, sensory intact to light touch bilaterally.  Skin: Warm, dry and intact. No rash, cyanosis, or clubbing.  Fungal changes to all toe nails which are TTP. Psychiatric: Normal mood and affect. speech and behavior is normal. Judgment and thought content normal. Cognition and memory are normal.   Assessment & Plan:    1. DM, type 2. Insulin dependent. Poor control due to financial hardship. -Samples of NPH 70/30 Sig#2 vials given to the patient. -Strongly advised to make an appointment with SW Ms. Eda Keys to assist with job placement. -Foot care, diet reviewed -UA micro albumin today -declined Pneumovax, Fluvax and Tdap -declined referral to Opthalmologist  2. Elevated BP -Normal BP upon recheck -low salt diet -exercise -RTC for FLP  3. Onychomycosis -start Lamisil PO x 12 weeks -baseline LFT's ordered -repeat LFT's in 6 wks.

## 2011-05-12 LAB — MICROALBUMIN / CREATININE URINE RATIO: Microalb Creat Ratio: 5 mg/g (ref 0.0–30.0)

## 2011-05-20 ENCOUNTER — Telehealth: Payer: Self-pay | Admitting: Licensed Clinical Social Worker

## 2011-06-02 ENCOUNTER — Telehealth: Payer: Self-pay | Admitting: Licensed Clinical Social Worker

## 2011-06-02 ENCOUNTER — Encounter: Payer: Self-pay | Admitting: Licensed Clinical Social Worker

## 2011-06-02 NOTE — Telephone Encounter (Signed)
Left message on 12/20 and again today.  Sending employment resources by mail including voc rehab information.

## 2011-06-02 NOTE — Telephone Encounter (Signed)
See phone note of 06/02/11.

## 2011-06-02 NOTE — Telephone Encounter (Signed)
Patient called this PM.  He informs me that he has been without work for about 3 years.  He worked for [employer] for 4 or 5 years.  He was in prison from 2002 to 2005.    He lives w/ friends who are supportive and will take him anywhere he wants to go.  He drives but doesn't have a license.  He said he does not have the support of family nor is he connected to church.   He has the orange card w/ Terry Castillo.    Education:  Went to the 11th grade in [place] and was in special classes for [employer].  Does not have GED and was unable to complete in prison.   Wants to get back to work and get his own place.  Discussed several job placement programs at [employer] including [employer], [employer], and [employer] for those with criminal record (Jobs on the Outside).  Encouraged him to pursue any and all of these opportunities.

## 2011-09-30 ENCOUNTER — Other Ambulatory Visit (INDEPENDENT_AMBULATORY_CARE_PROVIDER_SITE_OTHER): Payer: Self-pay

## 2011-09-30 DIAGNOSIS — B351 Tinea unguium: Secondary | ICD-10-CM

## 2011-09-30 DIAGNOSIS — Z91120 Patient's intentional underdosing of medication regimen due to financial hardship: Secondary | ICD-10-CM

## 2011-09-30 DIAGNOSIS — E119 Type 2 diabetes mellitus without complications: Secondary | ICD-10-CM

## 2011-09-30 LAB — COMPREHENSIVE METABOLIC PANEL
ALT: 12 U/L (ref 0–53)
Albumin: 4 g/dL (ref 3.5–5.2)
CO2: 24 mEq/L (ref 19–32)
Calcium: 8.8 mg/dL (ref 8.4–10.5)
Chloride: 103 mEq/L (ref 96–112)
Glucose, Bld: 150 mg/dL — ABNORMAL HIGH (ref 70–99)
Potassium: 4.1 mEq/L (ref 3.5–5.3)
Sodium: 136 mEq/L (ref 135–145)
Total Protein: 6.3 g/dL (ref 6.0–8.3)

## 2011-09-30 LAB — LIPID PANEL
Cholesterol: 175 mg/dL (ref 0–200)
Triglycerides: 55 mg/dL (ref <150)
VLDL: 11 mg/dL (ref 0–40)

## 2011-09-30 NOTE — Progress Notes (Signed)
Addended by: Remus Blake on: 09/30/2011 08:40 AM   Modules accepted: Orders

## 2011-10-12 ENCOUNTER — Encounter: Payer: Self-pay | Admitting: Internal Medicine

## 2011-10-12 ENCOUNTER — Ambulatory Visit (INDEPENDENT_AMBULATORY_CARE_PROVIDER_SITE_OTHER): Payer: Self-pay | Admitting: Internal Medicine

## 2011-10-12 VITALS — BP 130/79 | HR 98 | Temp 97.8°F | Ht 74.0 in | Wt 206.6 lb

## 2011-10-12 DIAGNOSIS — E119 Type 2 diabetes mellitus without complications: Secondary | ICD-10-CM

## 2011-10-12 DIAGNOSIS — Z560 Unemployment, unspecified: Secondary | ICD-10-CM

## 2011-10-12 DIAGNOSIS — E785 Hyperlipidemia, unspecified: Secondary | ICD-10-CM

## 2011-10-12 DIAGNOSIS — Z79899 Other long term (current) drug therapy: Secondary | ICD-10-CM

## 2011-10-12 LAB — GLUCOSE, CAPILLARY: Glucose-Capillary: 241 mg/dL — ABNORMAL HIGH (ref 70–99)

## 2011-10-12 LAB — POCT GLYCOSYLATED HEMOGLOBIN (HGB A1C): Hemoglobin A1C: 9.1

## 2011-10-12 MED ORDER — INSULIN NPH ISOPHANE & REGULAR (70-30) 100 UNIT/ML ~~LOC~~ SUSP: SUBCUTANEOUS | Status: DC

## 2011-10-12 NOTE — Patient Instructions (Addendum)
Please, take all medications as prescribed (incrase morning dose of your insulin up to 25 units and continue with 25 units at the evening) Please, call with any questions and follow up in 3 months or sooner if needed. Please, call Lynnae January, social worker for assistance with employment. Happy Spring!!!!

## 2011-10-12 NOTE — Progress Notes (Signed)
Patient ID: Terry Castillo, male   DOB: 1965/08/07, 46 y.o.   MRN: [id number] HPI:    F/U appointment. Reports adherence with his medication/diet regimen. Denies any hypoglycemic events. Denies any concerns with an exception of inquiry of his recent lab results. Review of Systems: Negative except per history of present illness  Physical Exam:  Nursing notes and vitals reviewed General:  alert, well-developed, and cooperative to examination.   Lungs:  normal respiratory effort, no accessory muscle use, normal breath sounds, no crackles, and no wheezes. Heart:  normal rate, regular rhythm, no murmurs, no gallop, and no rub.   Abdomen:  soft, non-tender, normal bowel sounds, no distention, no guarding, no rebound tenderness, no hepatomegaly, and no splenomegaly.   Extremities:  No cyanosis, clubbing, edema Neurologic:  alert & oriented X3, nonfocal exam  Meds: Current Outpatient Prescriptions on File Prior to Visit  Medication Sig Dispense Refill  . aspirin 81 MG chewable tablet Chew 81 mg by mouth daily.        Marland Kitchen glucose blood (TRUETRACK TEST) test strip Use to check blood sugars 3 times a day       . insulin NPH-insulin regular (NOVOLIN 70/30) (70-30) 100 UNIT/ML injection Inject 20 units SQ in the morning and 25 units SQ in the evening.  20 mL  11  . Insulin Syringe-Needle U-100 (SM INSULIN SYRINGE) 31G X 5/16" 0.5 ML MISC Inject 1 Syringe into the skin 2 (two) times daily. Use with Novolog 7030 as instructed 2(twice) daiy  100 each  11  . Lancet Devices MISC Use to check blood sugar 3 times a day       . pravastatin (PRAVACHOL) 40 MG tablet Take 40 mg by mouth daily.        Marland Kitchen terbinafine (LAMISIL) 250 MG tablet Take 1 tablet (250 mg total) by mouth daily.  42 tablet  0    Allergies: Review of patient's allergies indicates no known allergies. Past Medical History  Diagnosis Date  . Onychomycosis   . Diabetes mellitus     uncontrolled  . Alcohol abuse   . Tobacco abuse   . Atrial  fibrillation with RVR 2006    spontaneous conversion to sinus rhythm   No past surgical history on file. Family History  Problem Relation Age of Onset  . Heart disease Mother 60    Myocardial infarction  . Diabetes Mother   . Hypertension Mother   . Cancer Father    History   Social History  . Marital Status: Legally Separated    Spouse Name: N/A    Number of Children: N/A  . Years of Education: N/A   Occupational History  . Not on file.   Social History Main Topics  . Smoking status: Former Games developer  . Smokeless tobacco: Not on file   Comment: quit x 5months  . Alcohol Use: No     Quit in 2009  . Drug Use: No  . Sexually Active: Not on file   Other Topics Concern  . Not on file   Social History Narrative   Financial assistance approved for 100% discount at [facility] and has [facility] card per Rudell Cobb October 3,2011 1:56PMDropped out of 11th grade in high school.Used to work in [employer].No family in [place] (2 sisters and a brother in [place])   A/P: 1. DM, type 2, insulin dependent -HgbA1c is not at goal -will again increase NPH  Up to 25 units SQ bid -instructed to bring meter with each  OV  2. HLD -low cholesterol and fiber diet discussed; handout given  3. Unemployment -wil re-refer to Ms. Mosetta Putt (SW)

## 2011-10-14 ENCOUNTER — Telehealth: Payer: Self-pay | Admitting: Licensed Clinical Social Worker

## 2011-10-14 NOTE — Telephone Encounter (Signed)
Mr. Simmer was referred to CSW due to his unemployment.  CSW placed call to pt at H/M number, unable to leave message.  Work number is for [employer] and pt is currently unemployed.  CSW will continue to reach pt at H/M number.

## 2011-10-18 NOTE — Telephone Encounter (Signed)
CSW placed call to pt at H/M number, unable to leave message.

## 2011-10-22 NOTE — Telephone Encounter (Signed)
CSW unable to reach pt.  Will place vocational center information in the mail.

## 2011-10-22 NOTE — Telephone Encounter (Signed)
CSW mailed letter with vocational/career center information.

## 2012-05-01 ENCOUNTER — Encounter: Payer: Self-pay | Admitting: Internal Medicine

## 2012-06-08 ENCOUNTER — Encounter: Payer: Self-pay | Admitting: Internal Medicine

## 2012-10-02 ENCOUNTER — Ambulatory Visit: Payer: Self-pay

## 2012-10-04 IMAGING — CR DG SHOULDER 2+V*L*
4 series · 4 of 4 positions shown · non-contrast
Comparison: None.

CLINICAL DATA: Anterior shoulder pain for 1 day, no acute injury

LEFT SHOULDER - 2+ VIEW

[w shoulder ap internal left]
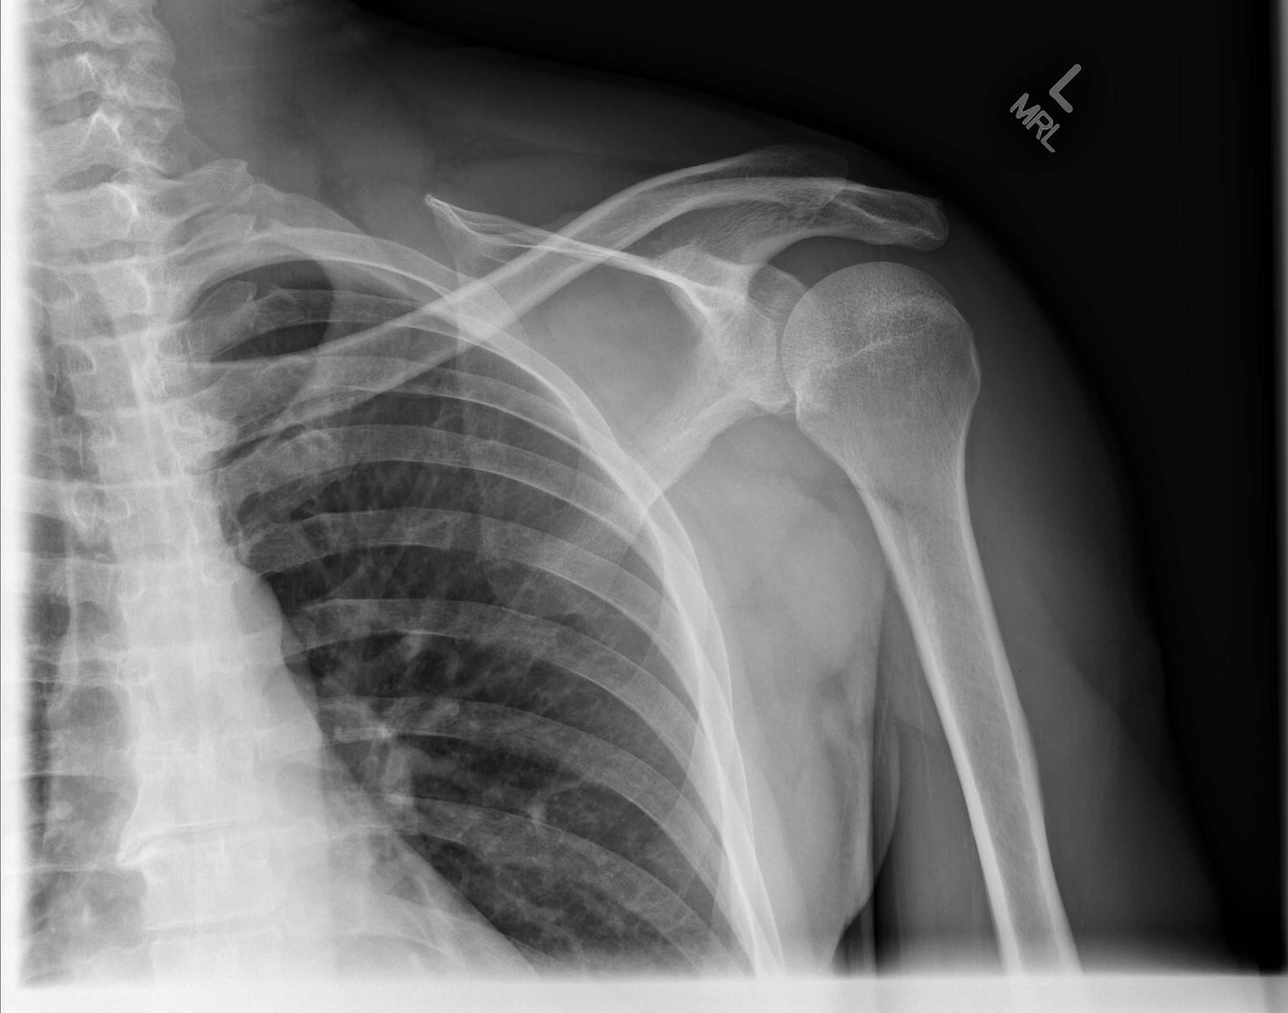

[w shoulder ap external left]
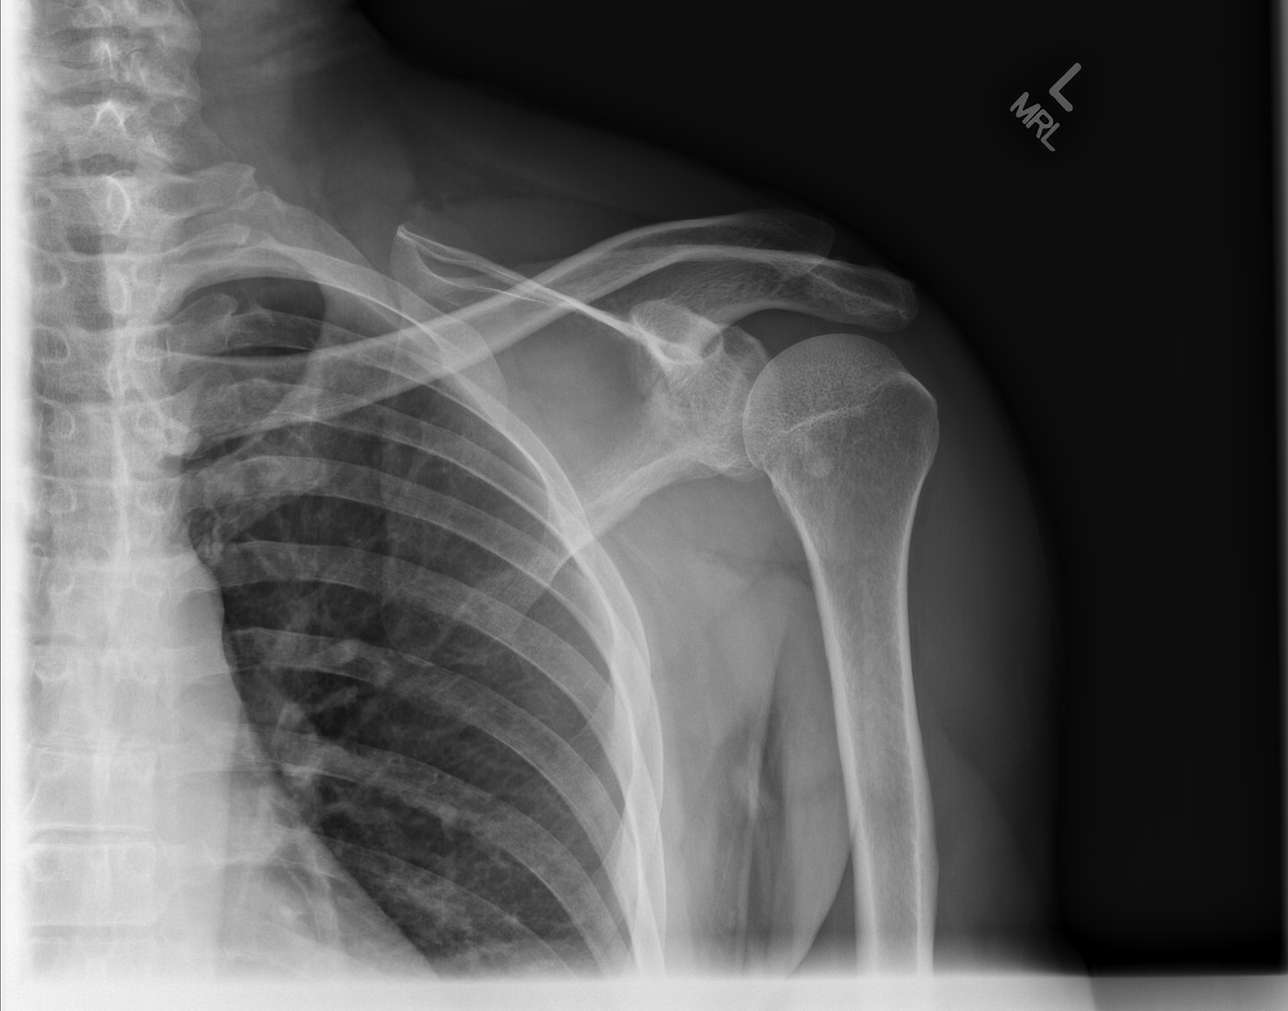

[w shoulder y view left (1 of 2)]
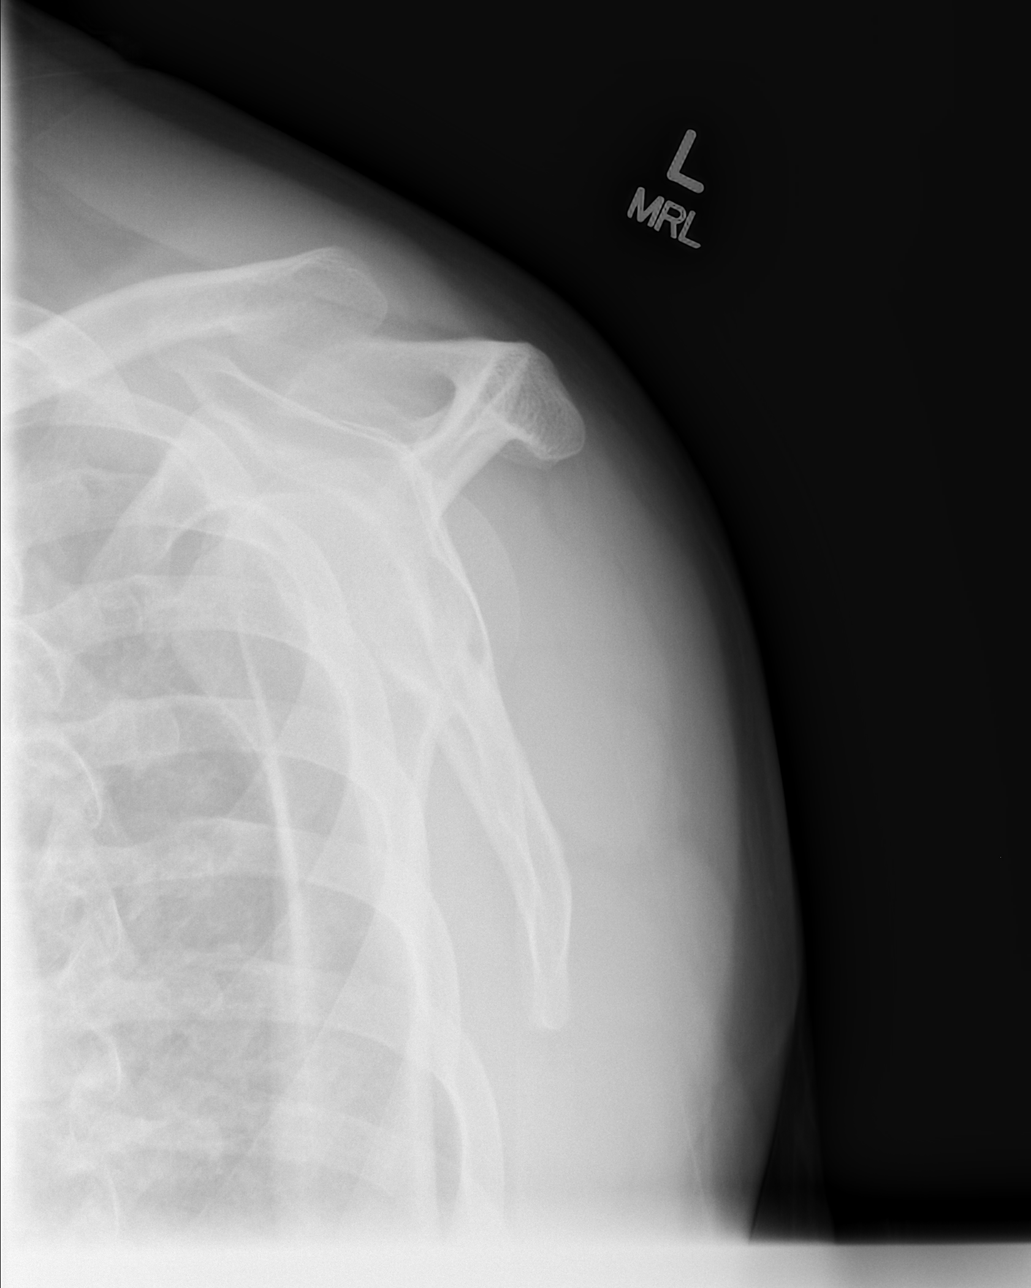

[w shoulder y view left (2 of 2)]
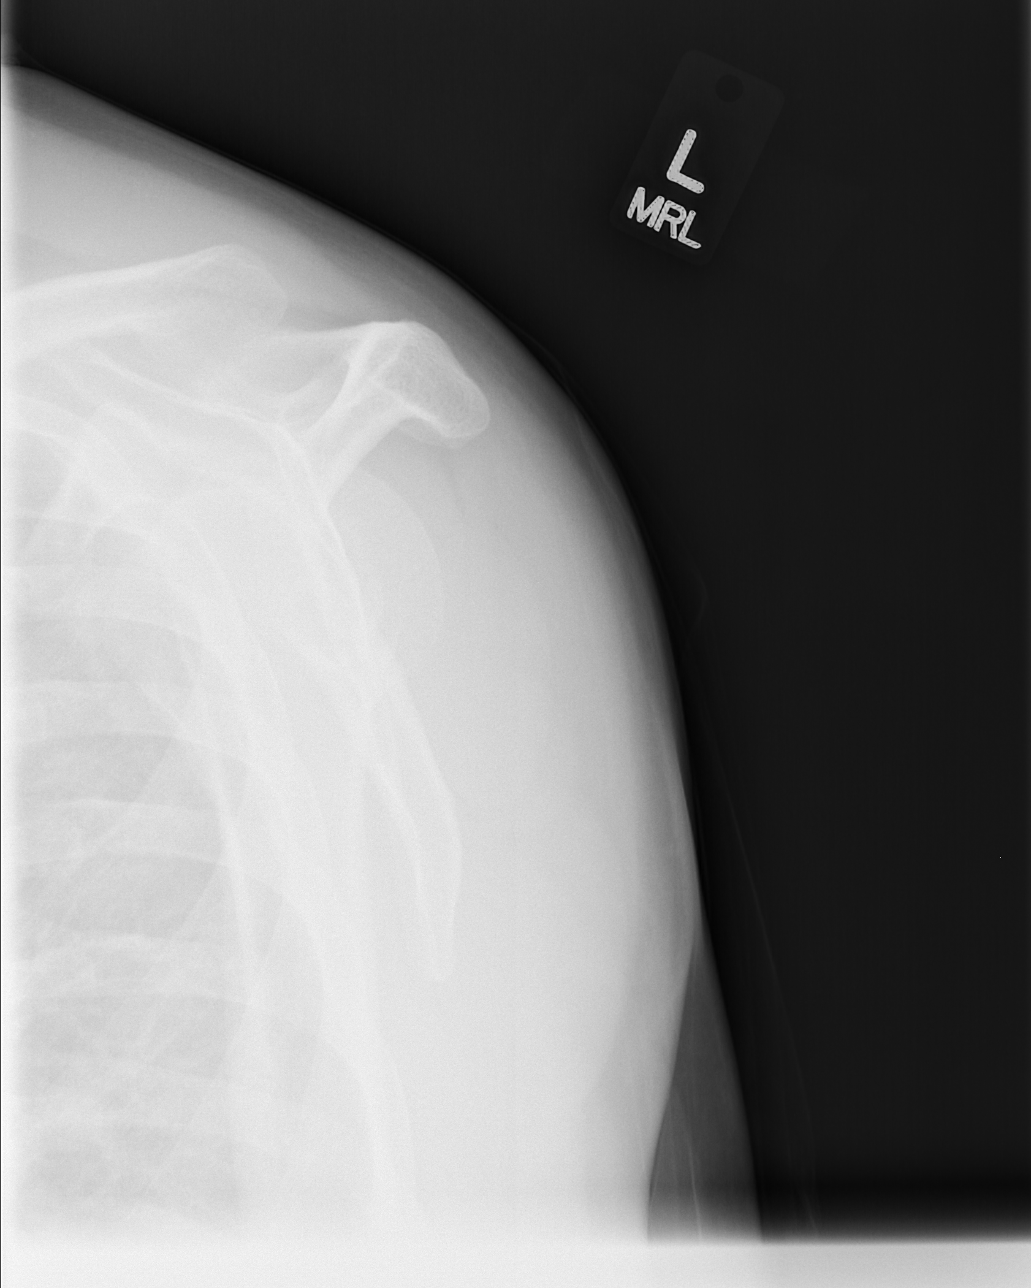

[4 of 4 positions shown; findings below may reference images not displayed]

FINDINGS: The left glenohumeral joint space is only slightly
narrowed.  No acute fracture is seen.  There is a sclerotic area
within the left humeral neck medially which appears to remain with
the humerus and probably represents a benign bone island.  No
abnormality of the ribs is seen and the left AC joint is normally
aligned.
IMPRESSION: No acute abnormality.  Perhaps slightly narrowed glenohumeral joint
space.

## 2012-10-09 ENCOUNTER — Ambulatory Visit: Payer: Self-pay | Admitting: Internal Medicine

## 2012-10-09 ENCOUNTER — Encounter: Payer: Self-pay | Admitting: Dietician

## 2012-10-17 ENCOUNTER — Other Ambulatory Visit: Payer: Self-pay | Admitting: *Deleted

## 2012-10-17 DIAGNOSIS — E119 Type 2 diabetes mellitus without complications: Secondary | ICD-10-CM

## 2012-10-18 ENCOUNTER — Telehealth: Payer: Self-pay | Admitting: *Deleted

## 2012-10-18 MED ORDER — INSULIN NPH ISOPHANE & REGULAR (70-30) 100 UNIT/ML ~~LOC~~ SUSP: SUBCUTANEOUS | Status: DC

## 2012-10-18 NOTE — Telephone Encounter (Signed)
Patient needs an appointment asap. He has not been seen in [facility] recently.

## 2012-10-18 NOTE — Telephone Encounter (Signed)
Pt's here at the clinic requesting refill on insulin. Talked to Dr Collier Bullock. Pt has an appt 6/5 - has not been seen x 1 yr. Novolin 70/30 rx given to pt - instructed to keep this appt and check his blood sugars; he agreed.

## 2012-11-02 ENCOUNTER — Ambulatory Visit (INDEPENDENT_AMBULATORY_CARE_PROVIDER_SITE_OTHER): Payer: No Typology Code available for payment source | Admitting: Internal Medicine

## 2012-11-02 ENCOUNTER — Encounter: Payer: Self-pay | Admitting: Internal Medicine

## 2012-11-02 VITALS — BP 147/84 | HR 89 | Temp 98.1°F | Ht 73.0 in | Wt 200.3 lb

## 2012-11-02 DIAGNOSIS — E119 Type 2 diabetes mellitus without complications: Secondary | ICD-10-CM

## 2012-11-02 DIAGNOSIS — E785 Hyperlipidemia, unspecified: Secondary | ICD-10-CM

## 2012-11-02 DIAGNOSIS — I4891 Unspecified atrial fibrillation: Secondary | ICD-10-CM

## 2012-11-02 LAB — GLUCOSE, CAPILLARY: Glucose-Capillary: 220 mg/dL — ABNORMAL HIGH (ref 70–99)

## 2012-11-02 MED ORDER — GLUCOSE BLOOD VI STRP: ORAL_STRIP | Status: DC

## 2012-11-02 MED ORDER — WAVESENSE AMP W/DEVICE KIT: PACK | Status: DC

## 2012-11-02 MED ORDER — INSULIN NPH ISOPHANE & REGULAR (70-30) 100 UNIT/ML ~~LOC~~ SUSP: SUBCUTANEOUS | Status: DC

## 2012-11-02 MED ORDER — PRAVASTATIN SODIUM 40 MG PO TABS: 40.0000 mg | ORAL_TABLET | Freq: Every day | ORAL | Status: DC

## 2012-11-02 NOTE — Patient Instructions (Signed)
Please pick up your prescriptions from the pharmacy: Insulin, glucometer, pravastatin (for cholesterol).  Please return to clinic in the next week to get your diabetic eye exam.  Please continue to take aspirin 81 mg daily.  Return to clinic in 3 months for follow up

## 2012-11-02 NOTE — Assessment & Plan Note (Addendum)
Check lipids today Will send prescription for pravastatin, [facility] for [facility]6.

## 2012-11-02 NOTE — Progress Notes (Signed)
Patient ID: Terry Castillo, male   DOB: [birth year], 47 y.o.   MRN: [id number] Internal Medicine Clinic Visit    HPI:  Terry Castillo is a 47 y.o. year old male with a history of diabetes, paroxysmal atrial fibrillation. He presents to the clinic for followup.  Patient states that his diabetes management is going well. He continues to take 70/30 25 units at noon and at midnight. He is compliant with his medication. He does state, however, that his glucometer broke a few months ago and he has not been checking his blood sugar regularly. Previous to his meter breaking, he denied any hypoglycemic episodes or hyperglycemic episodes. He denies any hypoglycemic symptoms of shakiness, sweating this, dizziness. Patient was first diagnosed with diabetes around 1999. He does not have any neuropathy or and/or continuing to T. Knows of. He has never had a dilated eye exam.  He is currently not taking a statin or an aspirin as is on med list.  He states that he quit smoking 3 years ago, congratulated the patient. He denies any alcohol, marijuana, cocaine or other drugs. He was previously incarcerated. Recently started a new job at [employer] after being unemployed.    Past Medical History  Diagnosis Date  . Onychomycosis   . Diabetes mellitus     uncontrolled  . Alcohol abuse   . Tobacco abuse   . Atrial fibrillation with RVR 2006    spontaneous conversion to sinus rhythm    No past surgical history on file.   ROS:  A complete review of systems was otherwise negative, except as noted in the HPI.  Allergies: Review of patient's allergies indicates no known allergies.  Medications: Current Outpatient Prescriptions  Medication Sig Dispense Refill  . aspirin 81 MG chewable tablet Chew 81 mg by mouth daily.        Marland Kitchen glucose blood (TRUETRACK TEST) test strip Use to check blood sugars 3 times a day       . insulin NPH-regular (NOVOLIN 70/30) (70-30) 100 UNIT/ML injection Inject 25 units SQ in the morning  and 25 units SQ in the evening.  20 mL  3  . Insulin Syringe-Needle U-100 (SM INSULIN SYRINGE) 31G X 5/16" 0.5 ML MISC Inject 1 Syringe into the skin 2 (two) times daily. Use with Novolog 7030 as instructed 2(twice) daiy  100 each  11  . Lancet Devices MISC Use to check blood sugar 3 times a day       . pravastatin (PRAVACHOL) 40 MG tablet Take 40 mg by mouth daily.         No current facility-administered medications for this visit.    History   Social History  . Marital Status: Legally Separated    Spouse Name: N/A    Number of Children: N/A  . Years of Education: N/A   Occupational History  . Not on file.   Social History Main Topics  . Smoking status: Former Games developer  . Smokeless tobacco: Not on file     Comment: quit x 5months  . Alcohol Use: No     Comment: Quit in 2009  . Drug Use: No  . Sexually Active: Not on file   Other Topics Concern  . Not on file   Social History Narrative   Financial assistance approved for 100% discount at [facility] and has [facility] card per Rudell Cobb October 3,2011 1:56PM      Dropped out of 11th grade in high school.   Used to work  in [employer]   Unemployed.   No family in [place] (2 sisters and a brother in [place]. [place])    family history includes Cancer in his father; Diabetes in his mother; Heart disease (age of onset: 72) in his mother; and Hypertension in his mother.  Physical Exam Blood pressure 147/84, pulse 89, temperature 98.1 F (36.7 C), temperature source Oral, height 6\' 1"  (1.854 m), weight 200 lb 4.8 oz (90.855 kg), SpO2 97.00%. General:  No acute distress, alert and oriented x 3, well-appearing AAM HEENT:  PERRL, EOMI, no lymphadenopathy, moist mucous membranes Cardiovascular:  Regular rate and rhythm, no murmurs, rubs or gallops Respiratory:  Clear to auscultation bilaterally, no wheezes, rales, or rhonchi Abdomen:  Soft, nondistended, nontender, normoactive bowel sounds Extremities:  Warm and well-perfused, no clubbing,  cyanosis, or edema. 1+ DP, no lesions or ulcerations. Mild onychomycosis noted b/l. Skin: Warm, dry, no rashes Neuro: Not anxious appearing, no depressed mood, normal affect   Labs: Lab Results  Component Value Date   CREATININE 0.98 09/30/2011   BUN 10 09/30/2011   NA 136 09/30/2011   K 4.1 09/30/2011   CL 103 09/30/2011   CO2 24 09/30/2011   Lab Results  Component Value Date   WBC 6.0 03/03/2009   HGB 14.7 03/03/2009   HCT 43.7 03/03/2009   MCV 86.7 03/03/2009   PLT 149* 03/03/2009      Assessment and Plan:   Plan was discussed with Aletta Edouard, MD Attending Physician.

## 2012-11-02 NOTE — Assessment & Plan Note (Addendum)
Lab Results  Component Value Date   HGBA1C 7.5 11/02/2012   HGBA1C 9.1 10/12/2011   HGBA1C 9.1 05/11/2011     Assessment: Diabetes control: fair control Progress toward A1C goal:   improved Comments:   Plan: Medications:  continue current medications Home glucose monitoring: Frequency: 3 times a day Timing: before breakfast;at bedtime;before meals Instruction/counseling given: reminded to get eye exam, reminded to bring blood glucose meter & log to each visit, discussed foot care and discussed diet Educational resources provided: brochure Self management tools provided: home glucose logbook Other plans:   RetinalCam for fundoscopic evaluation - patient cannot do it today, but will come back next week Cont 70/30 25u BID, refills given Patient was given a prescription for a new glucometer for [facility]. He agreed to use this 3 times per day and return to clinic with his glucometer.  Foot exam performed today, scheduled DP pulses, no lesions or ulcerations.

## 2012-11-03 LAB — MICROALBUMIN / CREATININE URINE RATIO
Microalb Creat Ratio: 5.6 mg/g (ref 0.0–30.0)
Microalb, Ur: 1.09 mg/dL (ref 0.00–1.89)

## 2012-11-03 LAB — BASIC METABOLIC PANEL WITH GFR
BUN: 13 mg/dL (ref 6–23)
Chloride: 102 mEq/L (ref 96–112)
GFR, Est Non African American: >89 mL/min
Glucose, Bld: 198 mg/dL — ABNORMAL HIGH (ref 70–99)
Potassium: 4.3 mEq/L (ref 3.5–5.3)

## 2012-11-03 LAB — LIPID PANEL: Cholesterol: 181 mg/dL (ref 0–200)

## 2012-11-03 NOTE — Assessment & Plan Note (Signed)
Regular rhythm today, patient denies palpitations.  -restart aspirin

## 2012-11-07 ENCOUNTER — Encounter: Payer: Self-pay | Admitting: Dietician

## 2012-11-08 ENCOUNTER — Ambulatory Visit: Payer: No Typology Code available for payment source | Admitting: Dietician

## 2012-11-08 NOTE — Progress Notes (Signed)
Retinal photos taken

## 2012-11-08 NOTE — Progress Notes (Signed)
TEACHING ATTENDING ADDENDUM: I discussed this case with Dr. Kesty at the time of the patient visit. I agree with the HPI, exam findings and have read the documentation provided by the resident,  and I concur with the plan of care. Please see the resident note for details of management.     

## 2012-12-07 ENCOUNTER — Other Ambulatory Visit: Payer: Self-pay

## 2013-04-30 ENCOUNTER — Ambulatory Visit (INDEPENDENT_AMBULATORY_CARE_PROVIDER_SITE_OTHER): Payer: No Typology Code available for payment source | Admitting: Internal Medicine

## 2013-04-30 ENCOUNTER — Encounter: Payer: Self-pay | Admitting: Internal Medicine

## 2013-04-30 VITALS — BP 125/76 | HR 91 | Temp 97.5°F | Ht 73.0 in | Wt 203.2 lb

## 2013-04-30 DIAGNOSIS — Z0000 Encounter for general adult medical examination without abnormal findings: Secondary | ICD-10-CM | POA: Insufficient documentation

## 2013-04-30 DIAGNOSIS — E785 Hyperlipidemia, unspecified: Secondary | ICD-10-CM

## 2013-04-30 DIAGNOSIS — I4891 Unspecified atrial fibrillation: Secondary | ICD-10-CM

## 2013-04-30 DIAGNOSIS — F17200 Nicotine dependence, unspecified, uncomplicated: Secondary | ICD-10-CM

## 2013-04-30 DIAGNOSIS — B351 Tinea unguium: Secondary | ICD-10-CM

## 2013-04-30 DIAGNOSIS — E119 Type 2 diabetes mellitus without complications: Secondary | ICD-10-CM

## 2013-04-30 LAB — GLUCOSE, CAPILLARY: Glucose-Capillary: 292 mg/dL — ABNORMAL HIGH (ref 70–99)

## 2013-04-30 LAB — POCT GLYCOSYLATED HEMOGLOBIN (HGB A1C): Hemoglobin A1C: 8.5

## 2013-04-30 MED ORDER — INSULIN NPH ISOPHANE & REGULAR (70-30) 100 UNIT/ML ~~LOC~~ SUSP: SUBCUTANEOUS | Status: DC

## 2013-04-30 NOTE — Assessment & Plan Note (Addendum)
Assesment: Patient with last lipid panel on 11/02/12 indicating hyperglyceridemia and LDL cholesterol of 95 who is on low-dose statin therapy with no myalgias or weakness.   Plan:  -Continue pravastatin 40 mg daily  -LDL 95 at goal <100, no dose titration at this time  -Obtain annual lipid panel June 2015 

## 2013-04-30 NOTE — Assessment & Plan Note (Addendum)
Pt declined annual influenza vaccination on 04/30/13

## 2013-04-30 NOTE — Assessment & Plan Note (Signed)
Assessment: Pt with fungus on toe nails of both feet without recent infection. Pt reports toe nail has fallen off in the past.    Plan: -Continue to monitor -If worsens, will try oral anti-fungal agent such as terbinafine or itraconazole due to ineffectiveness of topical agents

## 2013-04-30 NOTE — Assessment & Plan Note (Addendum)
Assessment:  Pt with uncomplicated insulin-dependent DM with last A1c of of 7.5 at last visit on 11/02/12 that is 8.5 (CBG 292)  today indicating average blood glucose of 197 over the past 3 months  who is compliant with taking insulin (70/30 Novolin) 25 U BID with no recent hypoglycemic events, polydipsia, polyuria, polyphagia,  weakness, blurred vision, paresthesias, weight change,  or recent infections. He had a retinal eye exam on last visit in June of 2014 however per report, retesting was suggested due to inadequate image of the left eye. His right eye did not reveal evidence of diabetic retinopathy. Feet with calluses without deformity or ulcers.   Plan: -HbA1c 8.5 not at goal >7.0 --> Increase (70/30) 25U BID to 30U AM & 27U PM, if CBG< 70 pt instructed to decrease AM dose to 28U -BP 125/76 today  at goal <140/90  -Last lipid panel on 6/514 with LDL 95 <goal 100, continue pravastatin 40 mg daily  -Last microalbumin/Cr  on 11/02/12 did not indicate proteinuria  -Foot exam June 2015 -Encourage weight loss, BMI >25  -To receive retinal exam on next visit in 1 month -Continue daily 81 mg aspirin for primary CV protection considering history of atrial fibrillation -Pt declined annual influenza vaccination

## 2013-04-30 NOTE — Patient Instructions (Addendum)
-  Please take 81 mg daily of aspirin to prevent stroke  -Please take you insulin 30U in AM and 27U in PM--if your glucose drops below 70 decrease your AM dose to 28U  -Will do your eye exam next time  -Please bring your glucose meter at next visit -See you back in 1 month, nice meeting you!

## 2013-04-30 NOTE — Progress Notes (Signed)
I saw and evaluated the patient.  I personally confirmed the key portions of the history and exam documented by Dr. Rabbani and I reviewed pertinent patient test results.  The assessment, diagnosis, and plan were formulated together and I agree with the documentation in the resident's note.  

## 2013-04-30 NOTE — Progress Notes (Signed)
Patient ID: Terry Castillo, male   DOB: [birth year], 47 y.o.   MRN: [id number]   Subjective:   Patient ID: Terry Castillo male   DOB: [birth year] 47 y.o.   MRN: [id number]  HPI: [name] is a 47 y.o.  very pleasant man with past medical history of paroxysmal atrial fibrillation, insulin-dependent Type II Diabetes Mellitus, and hyperlipidemia who presents for routine follow-up visit with no complaints.    Patient with last A1c of of 7.5 at last visit on 11/02/12. Pt reports he checks his blood sugars x2 daily (in the morning before breakfast and at night before bed). He forgot to bring his glucose meter in today but reports that his average readings have been in the 200's. He has been compliant with taking insulin (70/30 Novolin) 25 U BID. He denies any recent hypoglycemic events but states he had one event where he felt diaphoretic with hunger pains that resolved with eating about 3-4 months ago.  He denies polyuria, polydipsia, polyphagia, weakness, blurred vision, or paresthesias. No recent infections. He reports few calluses on his feet and fungal toe nail infection that is chronic. No recent injuries to his feet.  He has never had a MI or stroke. He reports following a low carbohydrate diet and avoids sweets. He has not had any recent changes to his weight and walks daily for exercise.  He had a retinal eye exam on last visit in June of 2014 however per report, retesting was suggested due to inadequate image of the left eye. His right eye did not reveal evidence of diabetic retinopathy.   He has history of paroxysmal atrial fibrillation in 2006. He denies palpitations, CP, dyspnea, dizziness, syncope, or prior blood clots. He states he does not take 81 mg aspirin daily, but every few days.   He reports being compliant with taking pravastatin 40 mg daily. He denies muscle pain or weakness.  He denies history of migraines or chronic headaches. No recent headaches or use of OTC pain medication.    He reports quitting smoking 2-3 years ago and denies alcohol or drug use.  He is currently homeless.      Past Medical History  Diagnosis Date  . Onychomycosis   . Diabetes mellitus     uncontrolled  . Alcohol abuse   . Tobacco abuse   . Atrial fibrillation with RVR 2006    spontaneous conversion to sinus rhythm   Current Outpatient Prescriptions  Medication Sig Dispense Refill  . aspirin 81 MG chewable tablet Chew 81 mg by mouth daily.        . Blood Glucose Monitoring Suppl (WAVESENSE AMP) W/DEVICE KIT Use to test blood glucose 3 times per day or as needed.  1 kit  0  . glucose blood (WAVESENSE PRESTO) test strip Use as instructed  100 each  12  . insulin NPH-regular (NOVOLIN 70/30) (70-30) 100 UNIT/ML injection Inject 25 units SQ in the morning and 25 units SQ in the evening.  20 mL  6  . Insulin Syringe-Needle U-100 (SM INSULIN SYRINGE) 31G X 5/16" 0.5 ML MISC Inject 1 Syringe into the skin 2 (two) times daily. Use with Novolog 7030 as instructed 2(twice) daiy  100 each  11  . Lancet Devices MISC Use to check blood sugar 3 times a day       . pravastatin (PRAVACHOL) 40 MG tablet Take 1 tablet (40 mg total) by mouth daily.  30 tablet  12   No current facility-administered medications  for this visit.   Family History  Problem Relation Age of Onset  . Heart disease Mother 78    Myocardial infarction  . Diabetes Mother   . Hypertension Mother   . Cancer Father    History   Social History  . Marital Status: Legally Separated    Spouse Name: N/A    Number of Children: N/A  . Years of Education: N/A   Social History Main Topics  . Smoking status: Former Games developer  . Smokeless tobacco: None     Comment: quit x 5months  . Alcohol Use: No     Comment: Quit in 2009  . Drug Use: No  . Sexual Activity: None   Other Topics Concern  . None   Social History Narrative   Financial assistance approved for 100% discount at [facility] and has [facility] card per Rudell Cobb October 3,2011  1:56PM      Dropped out of 11th grade in high school.   Used to work in [employer].   No family in [place] (2 sisters and a brother in [place])   Review of Systems: Review of Systems  Constitutional: Negative for fever, chills, weight loss, malaise/fatigue and diaphoresis.  HENT: Positive for congestion (2 weeks ago that resolved). Negative for sore throat.   Eyes: Negative for blurred vision, double vision, photophobia and redness.  Respiratory: Negative for cough and shortness of breath.   Cardiovascular: Negative for chest pain and palpitations.  Gastrointestinal: Negative for nausea, vomiting, abdominal pain, diarrhea, constipation, blood in stool and melena.  Genitourinary: Negative for dysuria, urgency, frequency and hematuria.  Musculoskeletal: Negative for back pain, joint pain, myalgias and neck pain.  Skin: Negative for rash.  Neurological: Negative for dizziness, sensory change, weakness and headaches.  Endo/Heme/Allergies: Negative for polydipsia.    Objective:  Physical Exam: Filed Vitals:   04/30/13 0826  BP: 125/76  Pulse: 91  Temp: 97.5 F (36.4 C)  TempSrc: Oral  Height: 6\' 1"  (1.854 m)  Weight: 203 lb 3.2 oz (92.171 kg)  SpO2: 98%   Physical Exam  Constitutional: He is oriented to person, place, and time. He appears well-developed and well-nourished. No distress.  Clothes smell of smoke  HENT:  Head: Normocephalic and atraumatic.  Right Ear: External ear normal.  Left Ear: External ear normal.  Nose: Nose normal.  Mouth/Throat: Oropharynx is clear and moist. No oropharyngeal exudate.  Eyes: Conjunctivae and EOM are normal. Pupils are equal, round, and reactive to light. Right eye exhibits no discharge. Left eye exhibits no discharge. No scleral icterus.  Red eyes b/l   Neck: Normal range of motion. Neck supple.  Cardiovascular: Normal rate, regular rhythm, normal heart sounds and intact distal pulses.   No murmur  heard. Pulmonary/Chest: Effort normal and breath sounds normal. No respiratory distress. He has no wheezes. He has no rales.  Abdominal: Soft. Bowel sounds are normal. He exhibits no distension. There is no tenderness. There is no rebound and no guarding.  Musculoskeletal: Normal range of motion. He exhibits no edema and no tenderness.  Neurological: He is alert and oriented to person, place, and time. No cranial nerve deficit.  Skin: Skin is warm and dry. No rash noted. He is not diaphoretic. No erythema. No pallor.  Psychiatric: He has a normal mood and affect. His behavior is normal. Judgment and thought content normal.    Assessment & Plan:  Please see problem-list for problem based assessment and plan

## 2013-04-30 NOTE — Assessment & Plan Note (Signed)
Assessment: Pt is a former 0.5 pack/day cigarette smoker for 10 -12 years who reports quitting smoking cigarettes 2-3 years ago.   Plan: -Continue to encourage smoking cessation -AAA screening at age 47

## 2013-04-30 NOTE — Assessment & Plan Note (Addendum)
Assessment: Pt with history of paroxysmal atrial fibrillation in 2006 with CHADS2 score of 1  (DM) indicating moderate risk of stroke (2.8% yearly risk) with no prior TIA, CVA or thromboembolism who is currently asymptomatic in normal sinus rhythm and not compliant with daily 81 mg aspirin. Since moderate risk based on CHADS2 score, either aspirin, warfarin or other AC therapy is indicated.    Plan: -Continue 81 mg daily AP therapy with aspirin for stroke prevention -Continue to monitor heart rhythm

## 2013-06-08 ENCOUNTER — Ambulatory Visit (INDEPENDENT_AMBULATORY_CARE_PROVIDER_SITE_OTHER): Payer: No Typology Code available for payment source | Admitting: Internal Medicine

## 2013-06-08 ENCOUNTER — Encounter: Payer: Self-pay | Admitting: Internal Medicine

## 2013-06-08 VITALS — BP 134/80 | HR 88 | Temp 97.6°F | Ht 73.0 in | Wt 204.6 lb

## 2013-06-08 DIAGNOSIS — F17201 Nicotine dependence, unspecified, in remission: Secondary | ICD-10-CM

## 2013-06-08 DIAGNOSIS — E785 Hyperlipidemia, unspecified: Secondary | ICD-10-CM

## 2013-06-08 DIAGNOSIS — E119 Type 2 diabetes mellitus without complications: Secondary | ICD-10-CM

## 2013-06-08 DIAGNOSIS — Z87891 Personal history of nicotine dependence: Secondary | ICD-10-CM

## 2013-06-08 DIAGNOSIS — B351 Tinea unguium: Secondary | ICD-10-CM

## 2013-06-08 DIAGNOSIS — I4891 Unspecified atrial fibrillation: Secondary | ICD-10-CM

## 2013-06-08 LAB — HM DIABETES EYE EXAM

## 2013-06-08 LAB — GLUCOSE, CAPILLARY: GLUCOSE-CAPILLARY: 187 mg/dL — AB (ref 70–99)

## 2013-06-08 MED ORDER — INSULIN NPH ISOPHANE & REGULAR (70-30) 100 UNIT/ML ~~LOC~~ SUSP: SUBCUTANEOUS | Status: DC

## 2013-06-08 NOTE — Patient Instructions (Signed)
-  Please bring your glucose meter with you next time and continue to check your sugars  -Apply over the counter lamisil cream to your toe nail fungus -Will obtain an eye exam today -Will see you back in 2 months  Onychomycosis/Fungal Toenails  WHAT IS IT? An infection that lies within the keratin of your nail plate that is caused by a fungus.  WHY ME? Fungal infections affect all ages, sexes, races, and creeds.  There may be many factors that predispose you to a fungal infection such as age, coexisting medical conditions such as diabetes, or an autoimmune disease; stress, medications, fatigue, genetics, etc.  Bottom line: fungus thrives in a warm, moist environment and your shoes offer such a location.  IS IT CONTAGIOUS? Theoretically, yes.  You do not want to share shoes, nail clippers or files with someone who has fungal toenails.  Walking around barefoot in the same room or sleeping in the same bed is unlikely to transfer the organism.  It is important to realize, however, that fungus can spread easily from one nail to the next on the same foot.  HOW DO WE TREAT THIS?  There are several ways to treat this condition.  Treatment may depend on many factors such as age, medications, pregnancy, liver and kidney conditions, etc.  It is best to ask your doctor which options are available to you.  1. No treatment.   Unlike many other medical concerns, you can live with this condition.  However for many people this can be a painful condition and may lead to ingrown toenails or a bacterial infection.  It is recommended that you keep the nails cut short to help reduce the amount of fungal nail. 2. Topical treatment.  These range from herbal remedies to prescription strength nail lacquers.  About 40-50% effective, topicals require twice daily application for approximately 9 to 12 months or until an entirely new nail has grown out.  The most effective topicals are medical grade medications available through  physicians offices. 3. Oral antifungal medications.  With an 80-90% cure rate, the most common oral medication requires 3 to 4 months of therapy and stays in your system for a year as the new nail grows out.  Oral antifungal medications do require blood work to make sure it is a safe drug for you.  A liver function panel will be performed prior to starting the medication and after the first month of treatment.  It is important to have the blood work performed to avoid any harmful side effects.  In general, this medication safe but blood work is required. 4. Laser Therapy.  This treatment is performed by applying a specialized laser to the affected nail plate.  This therapy is noninvasive, fast, and non-painful.  It is not covered by insurance and is therefore, out of pocket.  The results have been very good with a 80-95% cure rate.  The [facility] is the only practice in the area to offer this therapy. 5. Permanent Nail Avulsion.  Removing the entire nail so that a new nail will not grow back.

## 2013-06-08 NOTE — Progress Notes (Signed)
Patient ID: Terry Castillo, male   DOB: 02/24/1966, 48 y.o.   MRN: [id number]   Subjective:   Patient ID: Terry Castillo male   DOB: May 07, 1966 48 y.o.   MRN: [id number]  HPI: [name] is a 48 y.o. very pleasant man with past medical history of paroxysmal atrial fibrillation, insulin-dependent Type II Diabetes Mellitus, and hyperlipidemia who presents for follow-up visit after change in insulin regimen on last visit.    Patient with last A1c of of 8.5 at last visit on 04/30/13. Pt reports he checks his blood sugars once daily (in the morning before breakfast). He forgot to bring his glucose meter in today but reports that his average readings have been in the 170's. He has been compliant with taking insulin (70/30 Novolin) 30 U AM and 27 U PM. He denies any recent hypoglycemic events, polyuria, polydipsia, polyphagia, weakness, blurred vision,  paresthesias, or recent infections. He reports few calluses on his feet and fungal toe nail infection that is chronic. He has never used topical or oral anti-fungal agents in the past. He denies recent injuries to his feet or loss of toenails. He reports following a low carbohydrate diet with no recent changes in his weight. He walks daily for exercise. He had a retinal eye exam in June of 2014 however per report, retesting was suggested due to inadequate image of the left eye. His right eye did not reveal evidence of diabetic retinopathy.   He has history of paroxysmal atrial fibrillation in 2006. He denies palpitations, CP, dyspnea, dizziness, syncope, or prior blood clots. He states taking 81 mg aspirin daily.   He reports being compliant with taking pravastatin 40 mg daily. He denies muscle pain or weakness.   He reports quitting smoking 4-5 years ago and denies alcohol or drug use.     Past Medical History  Diagnosis Date  . Onychomycosis   . Diabetes mellitus     uncontrolled  . Alcohol abuse   . Tobacco abuse   . Atrial fibrillation with  RVR 2006    spontaneous conversion to sinus rhythm   Current Outpatient Prescriptions  Medication Sig Dispense Refill  . aspirin 81 MG chewable tablet Chew 81 mg by mouth daily.        . Blood Glucose Monitoring Suppl (WAVESENSE AMP) W/DEVICE KIT Use to test blood glucose 3 times per day or as needed.  1 kit  0  . glucose blood (WAVESENSE PRESTO) test strip Use as instructed  100 each  12  . insulin NPH-regular (NOVOLIN 70/30) (70-30) 100 UNIT/ML injection Inject 30 units SQ in the morning and 27 units SQ in the evening.  20 mL  6  . Insulin Syringe-Needle U-100 (SM INSULIN SYRINGE) 31G X 5/16" 0.5 ML MISC Inject 1 Syringe into the skin 2 (two) times daily. Use with Novolog 7030 as instructed 2(twice) daiy  100 each  11  . Lancet Devices MISC Use to check blood sugar 3 times a day       . pravastatin (PRAVACHOL) 40 MG tablet Take 1 tablet (40 mg total) by mouth daily.  30 tablet  12   No current facility-administered medications for this visit.   Family History  Problem Relation Age of Onset  . Heart disease Mother 73    Myocardial infarction  . Diabetes Mother   . Hypertension Mother   . Cancer Father    History   Social History  . Marital Status: Legally Separated  Spouse Name: N/A    Number of Children: N/A  . Years of Education: N/A   Social History Main Topics  . Smoking status: Former Research scientist (life sciences)  . Smokeless tobacco: None     Comment: quit x 43months  . Alcohol Use: No     Comment: Quit in 2009  . Drug Use: No  . Sexual Activity: None   Other Topics Concern  . None   Social History Narrative   Financial assistance approved for 100% discount at [facility] and has [facility] card per Bonna Gains October 3,2011 1:56PM      Dropped out of 11th grade in high school.   Used to work in [employer].   No family in [place] (2 sisters and a brother in [place])   Review of Systems: Review of Systems  Constitutional: Negative for fever, chills, weight loss and  malaise/fatigue.  HENT: Negative for congestion and sore throat.   Eyes: Negative for blurred vision.  Respiratory: Negative for cough and shortness of breath.   Cardiovascular: Negative for chest pain, palpitations and leg swelling.  Gastrointestinal: Negative for nausea, vomiting, abdominal pain, diarrhea, constipation and blood in stool.  Musculoskeletal: Negative for joint pain and myalgias.  Skin: Negative for rash.  Neurological: Negative for dizziness, sensory change, weakness and headaches.  Endo/Heme/Allergies: Negative for polydipsia.  Psychiatric/Behavioral: Negative for substance abuse.    Objective:  Physical Exam: Filed Vitals:   06/08/13 1322  BP: 134/80  Pulse: 88  Temp: 97.6 F (36.4 C)  TempSrc: Oral  Height: $Remove'6\' 1"'oNrgpwL$  (1.854 m)  Weight: 92.806 kg (204 lb 9.6 oz)  SpO2: 98%   Physical Exam  Constitutional: He is oriented to person, place, and time. He appears well-developed and well-nourished. No distress.  HENT:  Head: Normocephalic and atraumatic.  Right Ear: External ear normal.  Left Ear: External ear normal.  Nose: Nose normal.  Mouth/Throat: Oropharynx is clear and moist. No oropharyngeal exudate.  Eyes: Conjunctivae and EOM are normal. Pupils are equal, round, and reactive to light. Right eye exhibits no discharge. Left eye exhibits no discharge. No scleral icterus.  Bilateral conjunctival injection   Neck: Normal range of motion. Neck supple.  Cardiovascular: Normal rate, regular rhythm and normal heart sounds.   No murmur heard. Pulmonary/Chest: Effort normal and breath sounds normal. No respiratory distress. He has no wheezes. He has no rales. He exhibits no tenderness.  Abdominal: Soft. Bowel sounds are normal. He exhibits no distension. There is no tenderness. There is no rebound and no guarding.  Musculoskeletal: Normal range of motion. He exhibits no edema and no tenderness.  Neurological: He is alert and oriented to person, place, and time.    Skin: Skin is warm and dry. No rash noted. He is not diaphoretic. No erythema. No pallor.  Yellow, thickened, and crusted toenails bilaterally   Psychiatric: He has a normal mood and affect. Judgment and thought content normal.    Assessment & Plan:  Please see problem based assessment and plan

## 2013-06-16 NOTE — Assessment & Plan Note (Signed)
Assessment: Pt with history of paroxysmal atrial fibrillation in 2006 with CHADS2 score of 1 (DM) indicating moderate risk of stroke (2.8% yearly risk) with no prior TIA, CVA or thromboembolism who is currently asymptomatic in normal sinus rhythm and normal HR (88) and compliant with daily 81 mg aspirin. Since moderate risk based on CHADS2 score, either aspirin or warfarin AC therapy is indicated.   Plan:  -Continue 81 mg daily AP therapy with aspirin for stroke prevention  -Continue to monitor heart rate and rhythm

## 2013-06-16 NOTE — Assessment & Plan Note (Addendum)
Assessment: Pt with chronic onychomycosis of bilateral toenails not currently on anti-fungal therapy with no recent toenail loss.    Plan:  -Pt to try topical terbinafine, if not effective, will try PO terbinafine (on $4 [facility] list) -Obtain CMP and CBC prior to initiation of PO terbinafine 250 mg daily for 12 weeks (no renal or hepatic adjustment) -Continue to monitor

## 2013-06-16 NOTE — Assessment & Plan Note (Signed)
Assesment: Patient with last lipid panel on 11/02/12 indicating hyperglyceridemia and LDL cholesterol of 95 who is on low-dose statin therapy with no myalgias or weakness.   Plan:  -Continue pravastatin 40 mg daily  -LDL 95 at goal <100, no dose titration at this time  -Obtain annual lipid panel June 2015

## 2013-06-16 NOTE — Assessment & Plan Note (Addendum)
Assessment: Pt with uncomplicated insulin-dependent DM with last A1c of of 8.5 who is compliant with insulin regimen with no recent hypoglycemic events who presents with CBG of 187.    Plan:  -HbA1c 8.5 not at goal <7.0  -Continue Novolog  (70/30) 30U AM & 27U PM, pt to bring glucose meter at next visit for adjustment  -BP 134/80 today at goal <140/90  -Last lipid panel on 11/02/12 with LDL 95 at goal < 100, continue pravastatin 40 mg daily  -Last microalbumin/Cr on 11/02/12 did not indicate proteinuria  -Foot exam to be performed on June 2015  -Encourage weight loss, BMI >25  -Pt to receive retinal exam today 06/08/13  -Continue daily 81 mg aspirin for primary CV protection considering history of atrial fibrillation  -To return in 2 months and bring glucose meter for further insulin adjustment

## 2013-06-16 NOTE — Assessment & Plan Note (Signed)
Assessment: Pt is a former 0.5 pack/day cigarette smoker for 10 -12 years who reports quitting smoking cigarettes 4-5 years ago.   Plan:  -Continue to encourage smoking cessation  -AAA screening at age 48

## 2013-06-22 NOTE — Progress Notes (Signed)
Case discussed with Dr. Rabbani soon after the resident saw the patient.  We reviewed the resident's history and exam and pertinent patient test results.  I agree with the assessment, diagnosis, and plan of care documented in the resident's note. 

## 2013-07-22 ENCOUNTER — Emergency Department (HOSPITAL_COMMUNITY)
Admission: EM | Admit: 2013-07-22 | Discharge: 2013-07-22 | Disposition: A | Discharge status: Home / Self Care | Payer: No Typology Code available for payment source | Attending: Emergency Medicine | Admitting: Emergency Medicine

## 2013-07-22 ENCOUNTER — Encounter (HOSPITAL_COMMUNITY): Payer: Self-pay | Admitting: Emergency Medicine

## 2013-07-22 DIAGNOSIS — IMO0002 Reserved for concepts with insufficient information to code with codable children: Secondary | ICD-10-CM | POA: Insufficient documentation

## 2013-07-22 DIAGNOSIS — Z79899 Other long term (current) drug therapy: Secondary | ICD-10-CM | POA: Insufficient documentation

## 2013-07-22 DIAGNOSIS — Z8619 Personal history of other infectious and parasitic diseases: Secondary | ICD-10-CM | POA: Insufficient documentation

## 2013-07-22 DIAGNOSIS — Z794 Long term (current) use of insulin: Secondary | ICD-10-CM | POA: Insufficient documentation

## 2013-07-22 DIAGNOSIS — IMO0001 Reserved for inherently not codable concepts without codable children: Secondary | ICD-10-CM | POA: Insufficient documentation

## 2013-07-22 DIAGNOSIS — Z7982 Long term (current) use of aspirin: Secondary | ICD-10-CM | POA: Insufficient documentation

## 2013-07-22 DIAGNOSIS — E119 Type 2 diabetes mellitus without complications: Secondary | ICD-10-CM | POA: Insufficient documentation

## 2013-07-22 DIAGNOSIS — Z87891 Personal history of nicotine dependence: Secondary | ICD-10-CM | POA: Insufficient documentation

## 2013-07-22 DIAGNOSIS — Z8679 Personal history of other diseases of the circulatory system: Secondary | ICD-10-CM | POA: Insufficient documentation

## 2013-07-22 DIAGNOSIS — L02412 Cutaneous abscess of left axilla: Secondary | ICD-10-CM

## 2013-07-22 LAB — CBG MONITORING, ED: Glucose-Capillary: 123 mg/dL — ABNORMAL HIGH (ref 70–99)

## 2013-07-22 MED ORDER — DOXYCYCLINE HYCLATE 100 MG PO CAPS: 100.0000 mg | ORAL_CAPSULE | Freq: Two times a day (BID) | ORAL | Status: DC

## 2013-07-22 NOTE — ED Notes (Signed)
Pt c/o boil under left arm onset 3-4 days. Pt denies drainage at present. Pt has tried warm compresses without relief.

## 2013-07-22 NOTE — Discharge Instructions (Signed)
Take antibiotics prescribed to for the full course. Also recommend frequent warm soaks at least 4 times a day to promote drainage. Change your dressing once per day. Followup with your primary care doctor for a recheck in 48 hours. Return if symptoms worsen.  Abscess Care After An abscess (also called a boil or furuncle) is an infected area that contains a collection of pus. Signs and symptoms of an abscess include pain, tenderness, redness, or hardness, or you may feel a moveable soft area under your skin. An abscess can occur anywhere in the body. The infection may spread to surrounding tissues causing cellulitis. A cut (incision) by the surgeon was made over your abscess and the pus was drained out. Gauze may have been packed into the space to provide a drain that will allow the cavity to heal from the inside outwards. The boil may be painful for 5 to 7 days. Most people with a boil do not have high fevers. Your abscess, if seen early, may not have localized, and may not have been lanced. If not, another appointment may be required for this if it does not get better on its own or with medications. HOME CARE INSTRUCTIONS   Only take over-the-counter or prescription medicines for pain, discomfort, or fever as directed by your caregiver.  When you bathe, soak and then remove gauze or iodoform packs at least daily or as directed by your caregiver. You may then wash the wound gently with mild soapy water. Repack with gauze or do as your caregiver directs. SEEK IMMEDIATE MEDICAL CARE IF:   You develop increased pain, swelling, redness, drainage, or bleeding in the wound site.  You develop signs of generalized infection including muscle aches, chills, fever, or a general ill feeling.  An oral temperature above 102 F (38.9 C) develops, not controlled by medication. See your caregiver for a recheck if you develop any of the symptoms described above. If medications (antibiotics) were prescribed, take  them as directed. Document Released: 12/03/2004 Document Revised: 08/09/2011 Document Reviewed: 07/31/2007 [facility] Patient Information 2014 [place], [place].

## 2013-07-22 NOTE — ED Provider Notes (Signed)
CSN: [id number]     Arrival date & time 07/22/13  1029 History  This chart was scribed for non-physician practitioner, Antonietta Breach, PA-C working with Blanchard Kelch, MD by Frederich Balding, ED scribe. This patient was seen in room TR06C/TR06C and the patient's care was started at 11:02 AM.    Chief Complaint  Patient presents with  . Recurrent Skin Infections    left armpit    The history is provided by the patient. No language interpreter was used.   HPI Comments: Terry Castillo is a 48 y.o. male who presents to the Emergency Department complaining of a worsening abscess to his left axilla that started 3-4 days ago. He states the area has gotten more red and painful. Pt describes the pain as aching and throbbing. Denies drainage. He has used warm compresses with no improvement. Denies fever, numbness or weakness in arm.  Past Medical History  Diagnosis Date  . Onychomycosis   . Diabetes mellitus     uncontrolled  . Alcohol abuse   . Tobacco abuse   . Atrial fibrillation with RVR 2006    spontaneous conversion to sinus rhythm   History reviewed. No pertinent past surgical history. Family History  Problem Relation Age of Onset  . Heart disease Mother 1    Myocardial infarction  . Diabetes Mother   . Hypertension Mother   . Cancer Father    History  Substance Use Topics  . Smoking status: Former Research scientist (life sciences)  . Smokeless tobacco: Not on file     Comment: quit x 47month  . Alcohol Use: No     Comment: Quit in 2009    Review of Systems  Constitutional: Negative for fever.  Musculoskeletal: Positive for myalgias.  Skin: Positive for color change.  Neurological: Negative for weakness and numbness.  All other systems reviewed and are negative.   Allergies  Review of patient's allergies indicates no known allergies.  Home Medications   Current Outpatient Rx  Name  Route  Sig  Dispense  Refill  . aspirin 81 MG chewable tablet   Oral   Chew 81 mg by mouth daily.           . Blood Glucose Monitoring Suppl (WAVESENSE AMP) W/DEVICE KIT      Use to test blood glucose 3 times per day or as needed.   1 kit   0   . glucose blood (WAVESENSE PRESTO) test strip      Use as instructed   100 each   12   . insulin NPH-regular Human (NOVOLIN 70/30) (70-30) 100 UNIT/ML injection      Inject 30 units SQ in the morning and 27 units SQ in the evening.   20 mL   6   . Insulin Syringe-Needle U-100 (SM INSULIN SYRINGE) 31G X 5/16" 0.5 ML MISC   Subcutaneous   Inject 1 Syringe into the skin 2 (two) times daily. Use with Novolog 7030 as instructed 2(twice) daiy   100 each   11   . Lancet Devices MISC      Use to check blood sugar 3 times a day          . pravastatin (PRAVACHOL) 40 MG tablet   Oral   Take 1 tablet (40 mg total) by mouth daily.   30 tablet   12    BP 131/65  Pulse 109  Temp(Src) 98.7 F (37.1 C) (Oral)  Resp 19  Wt 220 lb (99.791 kg)  SpO2 96%  Physical Exam  Nursing note and vitals reviewed. Constitutional: He is oriented to person, place, and time. He appears well-developed and well-nourished. No distress.  HENT:  Head: Normocephalic and atraumatic.  Eyes: Conjunctivae and EOM are normal. No scleral icterus.  Neck: Normal range of motion.  Cardiovascular: Normal rate, regular rhythm, normal heart sounds and intact distal pulses.   Distal radial pulse 2+ in LUE  Pulmonary/Chest: Effort normal and breath sounds normal. No respiratory distress. He has no wheezes. He has no rales.  Musculoskeletal: Normal range of motion.  No gross sensory deficits. Distal radial pulses 2+. Shooing abscess to anterior left axilla that is approximately 4 x 3 cm and fluctuant. No drainage or weeping. No red linear streaking.   Neurological: He is alert and oriented to person, place, and time.  No gross sensory deficits appreciated. Patient moves extremities without ataxia.  Skin: Skin is warm and dry. No rash noted. He is not diaphoretic. No erythema.  No pallor.  Psychiatric: He has a normal mood and affect. His behavior is normal.    ED Course  Procedures (including critical care time)  DIAGNOSTIC STUDIES: Oxygen Saturation is 96% on RA, normal by my interpretation.    COORDINATION OF CARE: 11:04 AM-Discussed treatment plan which includes I&D and an antibiotic with pt at bedside and pt agreed to plan.   Labs Review Labs Reviewed  CBG MONITORING, ED - Abnormal; Notable for the following:    Glucose-Capillary 123 (*)    All other components within normal limits   Imaging Review No results found.  EKG Interpretation   None      INCISION AND DRAINAGE Performed by: Antonietta Breach Consent: Verbal consent obtained. Risks and benefits: risks, benefits and alternatives were discussed Type: abscess  Body area: L axilla  Anesthesia: local infiltration  Incision was made with a scalpel.  Local anesthetic: lidocaine 2% with epinephrine  Anesthetic total: 10 ml  Complexity: complex Blunt dissection to break up loculations  Drainage: purulent  Drainage amount: copious; approximately 8cc  Packing material: none  Patient tolerance: Patient tolerated the procedure well with no immediate complications.    MDM   Final diagnoses:  Abscess of axilla, left    Complicated abscess of the left axilla. Patient well and nontoxic appearing, hemodynamically stable, and afebrile. CBG 123. Patient neurovascularly intact on physical exam. No sensory deficits appreciated. Abscess incised and drained at bedside with copious amounts of purulent drainage. Patient stable and appropriate for discharge with instruction to followup with his primary care provider in 48 hours for a recheck. As patient is a diabetic, will start on course of doxycycline. Warm soaks advised. Return precautions discussed and patient agreeable to plan with no unaddressed concerns.  I personally performed the services described in this documentation, which was  scribed in my presence. The recorded information has been reviewed and is accurate.    Antonietta Breach, PA-C 07/22/13 1127

## 2013-07-23 NOTE — ED Provider Notes (Signed)
Medical screening examination/treatment/procedure(s) were performed by non-physician practitioner and as supervising physician I was immediately available for consultation/collaboration.  EKG Interpretation   None         Zahari Xiang S Legacie Dillingham, MD 07/23/13 1239 

## 2013-08-03 ENCOUNTER — Encounter: Payer: No Typology Code available for payment source | Admitting: Internal Medicine

## 2013-09-26 NOTE — Addendum Note (Signed)
Addended by: Bufford SpikesFULCHER, Lynanne Delgreco N on: 09/26/2013 04:20 PM   Modules accepted: Orders

## 2013-10-16 ENCOUNTER — Ambulatory Visit: Payer: Self-pay

## 2013-10-17 ENCOUNTER — Ambulatory Visit: Payer: No Typology Code available for payment source

## 2013-11-02 ENCOUNTER — Encounter: Payer: No Typology Code available for payment source | Admitting: Internal Medicine

## 2013-11-09 ENCOUNTER — Ambulatory Visit (INDEPENDENT_AMBULATORY_CARE_PROVIDER_SITE_OTHER): Payer: No Typology Code available for payment source | Admitting: Internal Medicine

## 2013-11-09 ENCOUNTER — Encounter: Payer: Self-pay | Admitting: Internal Medicine

## 2013-11-09 VITALS — BP 127/79 | HR 97 | Temp 97.6°F | Wt 203.1 lb

## 2013-11-09 DIAGNOSIS — I4891 Unspecified atrial fibrillation: Secondary | ICD-10-CM

## 2013-11-09 DIAGNOSIS — E785 Hyperlipidemia, unspecified: Secondary | ICD-10-CM

## 2013-11-09 DIAGNOSIS — E119 Type 2 diabetes mellitus without complications: Secondary | ICD-10-CM

## 2013-11-09 DIAGNOSIS — X58XXXA Exposure to other specified factors, initial encounter: Secondary | ICD-10-CM

## 2013-11-09 DIAGNOSIS — S025XXA Fracture of tooth (traumatic), initial encounter for closed fracture: Secondary | ICD-10-CM

## 2013-11-09 DIAGNOSIS — B351 Tinea unguium: Secondary | ICD-10-CM

## 2013-11-09 LAB — CBC
HCT: 40.4 % (ref 39.0–52.0)
Hemoglobin: 14 g/dL (ref 13.0–17.0)
MCH: 29.7 pg (ref 26.0–34.0)
MCHC: 34.7 g/dL (ref 30.0–36.0)
MCV: 85.6 fL (ref 78.0–100.0)
PLATELETS: 193 10*3/uL (ref 150–400)
RBC: 4.72 MIL/uL (ref 4.22–5.81)
RDW: 16.4 % — AB (ref 11.5–15.5)
WBC: 8.6 10*3/uL (ref 4.0–10.5)

## 2013-11-09 LAB — GLUCOSE, CAPILLARY: Glucose-Capillary: 129 mg/dL — ABNORMAL HIGH (ref 70–99)

## 2013-11-09 LAB — POCT GLYCOSYLATED HEMOGLOBIN (HGB A1C): HEMOGLOBIN A1C: 8.5

## 2013-11-09 MED ORDER — TERBINAFINE HCL 250 MG PO TABS: 250.0000 mg | ORAL_TABLET | Freq: Every day | ORAL | Status: DC

## 2013-11-09 MED ORDER — "INSULIN SYRINGE-NEEDLE U-100 31G X 5/16"" 0.5 ML MISC": 1.0000 | Freq: Two times a day (BID) | Status: DC

## 2013-11-09 MED ORDER — METFORMIN HCL 500 MG PO TABS: 500.0000 mg | ORAL_TABLET | Freq: Every day | ORAL | Status: DC

## 2013-11-09 NOTE — Patient Instructions (Addendum)
-Start taking metformin 500 mg daily in the morning -Take Novolin (70/30) 30 U in AM and 27 U in PM -Will give you another meter and refill your supplies -Start taking lamisil 250 mg daily for 3 months for toenail fungus -Will refer you to dentistry -Will check your labs today -Will see you back in 3 months, nice seeing you again!  Onychomycosis/Fungal Toenails  WHAT IS IT? An infection that lies within the keratin of your nail plate that is caused by a fungus.  WHY ME? Fungal infections affect all ages, sexes, races, and creeds.  There may be many factors that predispose you to a fungal infection such as age, coexisting medical conditions such as diabetes, or an autoimmune disease; stress, medications, fatigue, genetics, etc.  Bottom line: fungus thrives in a warm, moist environment and your shoes offer such a location.  IS IT CONTAGIOUS? Theoretically, yes.  You do not want to share shoes, nail clippers or files with someone who has fungal toenails.  Walking around barefoot in the same room or sleeping in the same bed is unlikely to transfer the organism.  It is important to realize, however, that fungus can spread easily from one nail to the next on the same foot.  HOW DO WE TREAT THIS?  There are several ways to treat this condition.  Treatment may depend on many factors such as age, medications, pregnancy, liver and kidney conditions, etc.  It is best to ask your doctor which options are available to you.  1. No treatment.   Unlike many other medical concerns, you can live with this condition.  However for many people this can be a painful condition and may lead to ingrown toenails or a bacterial infection.  It is recommended that you keep the nails cut short to help reduce the amount of fungal nail. 2. Topical treatment.  These range from herbal remedies to prescription strength nail lacquers.  About 40-50% effective, topicals require twice daily application for approximately 9 to 12 months  or until an entirely new nail has grown out.  The most effective topicals are medical grade medications available through physicians offices. 3. Oral antifungal medications.  With an 80-90% cure rate, the most common oral medication requires 3 to 4 months of therapy and stays in your system for a year as the new nail grows out.  Oral antifungal medications do require blood work to make sure it is a safe drug for you.  A liver function panel will be performed prior to starting the medication and after the first month of treatment.  It is important to have the blood work performed to avoid any harmful side effects.  In general, this medication safe but blood work is required. 4. Laser Therapy.  This treatment is performed by applying a specialized laser to the affected nail plate.  This therapy is noninvasive, fast, and non-painful.  It is not covered by insurance and is therefore, out of pocket.  The results have been very good with a 80-95% cure rate.  [facility] is the only practice in the area to offer this therapy. 5. Permanent Nail Avulsion.  Removing the entire nail so that a new nail will not grow back.   General Instructions:   Please try to bring all your medicines next time. This will help us keep you safe from mistakes.   Progress Toward Treatment Goals:  No flowsheet data found.  Self Care Goals & Plans:  Self Care Goal 04/30/2013  Manage my medications take my medicines as prescribed; refill my medications on time  Monitor my health keep track of my blood glucose; bring my glucose meter and log to each visit; check my feet daily  Eat healthy foods eat foods that are low in salt; eat baked foods instead of fried foods  Be physically active find an activity I enjoy    Home Blood Glucose Monitoring 11/02/2012  Check my blood sugar 3 times a day  When to check my blood sugar before breakfast; at bedtime; before meals     Care Management & Community Referrals:  No  flowsheet data found.

## 2013-11-09 NOTE — Progress Notes (Signed)
Patient ID: AVRY ROEDL, male   DOB: 1966-02-11, 48 y.o.   MRN: [id number]   Subjective:   Patient ID: NATHANIEL WAKELEY male   DOB: 1965-12-06 48 y.o.   MRN: [id number]  HPI: Mr.Winifred A Kueker is a 48 y.o. very pleasant man with past medical history of paroxysmal atrial fibrillation not on AC therapy, insulin-dependent Type II Diabetes Mellitus, and hyperlipidemia who presents for routine follow-up visit and complaint of broken tooth.   Pt reports that three days ago he chipped his right lower molar tooth on a bone. He reports pain is controlled and is requesting a dental referral.   Patient with last A1c of of 8.5 on 04/30/13. He reports he checks his blood sugars daily with average values in 120's. He requests a new glucose meter because his old one broke. He has been compliant with taking insulin (70/30 Novolin) 30 U AM and 25 U, however at last visit was to use 27 U PM. He reports occasional hypoglycemic episodes, but denies polyuria, polydipsia, polyphagia, blurry vision, or neuropathy. He had a retinal  eye exam in January that was normal. He reports the calluses on his feet are stable without ulceration. His toe nail fungal infection has not improved after topical anti-fungal use. He denies recent injuries to his feet or loss of toenails. He reports following a low carbohydrate diet and stays active with no recent changes in his weight.    He has history of paroxysmal atrial fibrillation in 2006. He denies palpitations, chest pain, dyspnea, lightheadedeness, weakness, or syncope. He is compliant with taking 81 mg aspirin daily.   He reports compliance with taking pravastatin 40 mg daily. He denies muscle pain or weakness.     Past Medical History  Diagnosis Date  . Onychomycosis   . Diabetes mellitus     uncontrolled  . Alcohol abuse   . Tobacco abuse   . Atrial fibrillation with RVR 2006    spontaneous conversion to sinus rhythm   Current Outpatient Prescriptions  Medication Sig  Dispense Refill  . aspirin 81 MG chewable tablet Chew 81 mg by mouth daily.        . Blood Glucose Monitoring Suppl (WAVESENSE AMP) W/DEVICE KIT Use to test blood glucose 3 times per day or as needed.  1 kit  0  . doxycycline (VIBRAMYCIN) 100 MG capsule Take 1 capsule (100 mg total) by mouth 2 (two) times daily.  20 capsule  0  . glucose blood (WAVESENSE PRESTO) test strip Use as instructed  100 each  12  . insulin NPH-regular Human (NOVOLIN 70/30) (70-30) 100 UNIT/ML injection Inject 30 units SQ in the morning and 27 units SQ in the evening.  20 mL  6  . Insulin Syringe-Needle U-100 (SM INSULIN SYRINGE) 31G X 5/16" 0.5 ML MISC Inject 1 Syringe into the skin 2 (two) times daily. Use with Novolog 7030 as instructed 2(twice) daiy  100 each  11  . Lancet Devices MISC Use to check blood sugar 3 times a day       . pravastatin (PRAVACHOL) 40 MG tablet Take 1 tablet (40 mg total) by mouth daily.  30 tablet  12   No current facility-administered medications for this visit.   Family History  Problem Relation Age of Onset  . Heart disease Mother 63    Myocardial infarction  . Diabetes Mother   . Hypertension Mother   . Cancer Father    History   Social History  . Marital  Status: Legally Separated    Spouse Name: N/A    Number of Children: N/A  . Years of Education: 26   Social History Main Topics  . Smoking status: Former Research scientist (life sciences)  . Smokeless tobacco: Not on file     Comment: quit x 19month  . Alcohol Use: No     Comment: Quit in 2009  . Drug Use: No  . Sexual Activity: Not on file   Other Topics Concern  . Not on file   Social History Narrative   [facility] per DBonna GainsOctober 3,2011 1:56PM      Dropped out of 11th grade in high school.   Used to work in [employer]   [place] family [place](2 sisters [place]   Review of Systems: Review of Systems  Constitutional: Negative for  fever, chills, weight loss and malaise/fatigue.  HENT: Negative for congestion and sore throat.   Eyes: Negative for blurred vision.  Respiratory: Negative for cough and shortness of breath.   Cardiovascular: Negative for chest pain, palpitations and leg swelling.  Gastrointestinal: Negative for nausea, vomiting, abdominal pain, diarrhea, constipation and blood in stool.  Genitourinary: Negative for dysuria, urgency, frequency and hematuria.  Musculoskeletal: Negative for myalgias.  Neurological: Negative for dizziness, sensory change, weakness and headaches.  Psychiatric/Behavioral: Negative for substance abuse.     Objective:  Physical Exam: Filed Vitals:   11/09/13 1400  BP: 127/79  Pulse: 97  Temp: 97.6 F (36.4 C)  TempSrc: Oral  Weight: 203 lb 1.6 oz (92.126 kg)  SpO2: 97%    Physical Exam  Constitutional: He is oriented to person, place, and time. He appears well-developed and well-nourished. No distress.  HENT:  Head: Normocephalic and atraumatic.  Right Ear: External ear normal.  Left Ear: External ear normal.  Nose: Nose normal.  Mouth/Throat: Oropharynx is clear and moist. No oropharyngeal exudate.  Eyes: Conjunctivae and EOM are normal. Pupils are equal, round, and reactive to light. Right eye exhibits no discharge. Left eye exhibits no discharge. No scleral icterus.  Neck: Normal range of motion. Neck supple.  Cardiovascular: Normal rate, regular rhythm and normal heart sounds.   Pulmonary/Chest: Effort normal and breath sounds normal. No respiratory distress. He has no wheezes. He has no rales.  Abdominal: Soft. Bowel sounds are normal.  Musculoskeletal: Normal range of motion. He exhibits no edema and no tenderness.  Neurological: He is alert and oriented to person, place, and time. No cranial nerve deficit.  Skin: Skin is warm and dry. No rash noted. He is not diaphoretic. No erythema. No pallor.  Yellow thickened toenails bilaterally   Psychiatric: He has a  normal mood and affect. His behavior is normal. Judgment and thought content normal.    Assessment & Plan:   Please see problem list for problem-based assessment and plan

## 2013-11-10 ENCOUNTER — Encounter: Payer: Self-pay | Admitting: Internal Medicine

## 2013-11-10 DIAGNOSIS — S025XXA Fracture of tooth (traumatic), initial encounter for closed fracture: Secondary | ICD-10-CM | POA: Insufficient documentation

## 2013-11-10 LAB — LIPID PANEL
CHOL/HDL RATIO: 3.5 ratio
Cholesterol: 173 mg/dL (ref 0–200)
HDL: 50 mg/dL (ref >39)
LDL Cholesterol: 94 mg/dL (ref 0–99)
TRIGLYCERIDES: 143 mg/dL (ref <150)
VLDL: 29 mg/dL (ref 0–40)

## 2013-11-10 LAB — COMPLETE METABOLIC PANEL WITH GFR
ALBUMIN: 4.1 g/dL (ref 3.5–5.2)
ALT: 18 U/L (ref 0–53)
AST: 17 U/L (ref 0–37)
Alkaline Phosphatase: 86 U/L (ref 39–117)
BUN: 10 mg/dL (ref 6–23)
CHLORIDE: 106 meq/L (ref 96–112)
CO2: 23 mEq/L (ref 19–32)
CREATININE: 0.98 mg/dL (ref 0.50–1.35)
Calcium: 8.9 mg/dL (ref 8.4–10.5)
GFR, Est African American: >89 mL/min
GFR, Est Non African American: >89 mL/min
Glucose, Bld: 113 mg/dL — ABNORMAL HIGH (ref 70–99)
POTASSIUM: 4.5 meq/L (ref 3.5–5.3)
SODIUM: 137 meq/L (ref 135–145)
Total Bilirubin: 0.7 mg/dL (ref 0.2–1.2)
Total Protein: 6.3 g/dL (ref 6.0–8.3)

## 2013-11-10 LAB — MICROALBUMIN / CREATININE URINE RATIO
Creatinine, Urine: 146.8 mg/dL
Microalb Creat Ratio: 3.4 mg/g (ref 0.0–30.0)
Microalb, Ur: 0.5 mg/dL (ref 0.00–1.89)

## 2013-11-10 NOTE — Assessment & Plan Note (Addendum)
Assessment: Pt with uncomplicated insulin-dependent DM with last A1c of of 8.5 who is compliant with insulin therapy with occasional symptomatic hypoglycemia who presents with unchanged A1c and glucose of 113.  Plan:  -HbA1c 8.5 not at goal <7.0, start metformin 500 mg daily in AM, continue Novolog (70/30) 30 U AM & 27 U PM  -BP 127/79 at goal <140/90, currently not on anti-hypertensive therapy  -Obtain annual lipid panel ---> LDL 94 at goal < 100, continue pravastatin 40 mg daily  -Obtain annual urine microalbumin ---> no proteinuria  -Last annual dilated eye exam on 06/08/13 normal  -Annual foot exam today  -BMI 26.8 not at goal <25, encourage weight loss  -Continue daily 81 mg aspirin for primary CVD protection -To return in 3 months and instructed to bring glucose meter for further insulin adjustment

## 2013-11-10 NOTE — Assessment & Plan Note (Signed)
Assessment: Pt with history of PAF in 2006 with CHADS2 score of 1, compliant with AP therapy, who presents with normal cardiac rate and rhythm.   Plan:  -Continue 81 mg daily aspirin for stroke prevention  -Continue to monitor

## 2013-11-10 NOTE — Assessment & Plan Note (Signed)
Assessment: Pt with chronic onychomycosis of bilateral toenails without response to topical anti-fungal therapy who presents with no recent toenail loss.   Plan:  -Obtain baseline CMP & CBC ----> normal  -Start PO terbinafine for 12 weeks   -To return in 12 weeks

## 2013-11-10 NOTE — Assessment & Plan Note (Addendum)
Assessment: Pt with recent tooth fracture due to eating a bone who presents with well-controlled pain.    Plan: -Refer to dentistry -Pt instructed to take OTC NSAID's or acetaminophen PRN

## 2013-11-10 NOTE — Assessment & Plan Note (Signed)
Assesment: Patient with last lipid panel on 11/02/12 indicating hyperglyceridemia compliant with moderate dose statin therapy with 10-yr ASCVD risk of 8.9% and lifetime risk of 50%.   Plan:  -Obtain annual lipid panel ---> improved TG, total cholesterol, and LDH cholesterol -Continue pravastatin 40 mg daily  -Obtain CMP --> normal -Monitor for myalgias

## 2013-11-14 NOTE — Progress Notes (Signed)
Case discussed with Dr. Rabbani at the time of the visit.  We reviewed the resident's history and exam and pertinent patient test results.  I agree with the assessment, diagnosis, and plan of care documented in the resident's note. 

## 2013-12-24 ENCOUNTER — Encounter: Payer: Self-pay | Admitting: Dietician

## 2014-02-13 ENCOUNTER — Other Ambulatory Visit: Payer: Self-pay | Admitting: *Deleted

## 2014-02-13 DIAGNOSIS — E119 Type 2 diabetes mellitus without complications: Secondary | ICD-10-CM

## 2014-02-13 MED ORDER — INSULIN NPH ISOPHANE & REGULAR (70-30) 100 UNIT/ML ~~LOC~~ SUSP: SUBCUTANEOUS | Status: DC

## 2014-02-16 ENCOUNTER — Other Ambulatory Visit: Payer: Self-pay | Admitting: Internal Medicine

## 2014-02-16 MED ORDER — INSULIN NPH ISOPHANE & REGULAR (70-30) 100 UNIT/ML ~~LOC~~ SUSP: SUBCUTANEOUS | Status: DC

## 2014-03-04 ENCOUNTER — Encounter: Payer: Self-pay | Admitting: *Deleted

## 2014-04-04 ENCOUNTER — Ambulatory Visit: Payer: No Typology Code available for payment source

## 2014-04-30 ENCOUNTER — Ambulatory Visit: Payer: No Typology Code available for payment source

## 2014-05-03 ENCOUNTER — Encounter: Payer: Self-pay | Admitting: Internal Medicine

## 2014-06-13 ENCOUNTER — Other Ambulatory Visit: Payer: Self-pay | Admitting: *Deleted

## 2014-06-13 DIAGNOSIS — B351 Tinea unguium: Secondary | ICD-10-CM

## 2014-06-21 ENCOUNTER — Encounter: Payer: Self-pay | Admitting: Internal Medicine

## 2014-06-21 ENCOUNTER — Encounter: Payer: Self-pay | Admitting: Dietician

## 2014-06-28 ENCOUNTER — Encounter: Payer: Self-pay | Admitting: Internal Medicine

## 2014-06-28 ENCOUNTER — Ambulatory Visit (INDEPENDENT_AMBULATORY_CARE_PROVIDER_SITE_OTHER): Payer: Self-pay | Admitting: Internal Medicine

## 2014-06-28 VITALS — BP 139/82 | HR 86 | Temp 98.4°F | Wt 200.9 lb

## 2014-06-28 DIAGNOSIS — Z0000 Encounter for general adult medical examination without abnormal findings: Secondary | ICD-10-CM

## 2014-06-28 DIAGNOSIS — E119 Type 2 diabetes mellitus without complications: Secondary | ICD-10-CM

## 2014-06-28 DIAGNOSIS — Z794 Long term (current) use of insulin: Secondary | ICD-10-CM

## 2014-06-28 DIAGNOSIS — B351 Tinea unguium: Secondary | ICD-10-CM

## 2014-06-28 DIAGNOSIS — I480 Paroxysmal atrial fibrillation: Secondary | ICD-10-CM

## 2014-06-28 LAB — POCT GLYCOSYLATED HEMOGLOBIN (HGB A1C): HEMOGLOBIN A1C: 7.6

## 2014-06-28 LAB — CBC WITH DIFFERENTIAL/PLATELET
BASOS PCT: 1 % (ref 0–1)
Basophils Absolute: 0.1 10*3/uL (ref 0.0–0.1)
EOS ABS: 0.2 10*3/uL (ref 0.0–0.7)
EOS PCT: 3 % (ref 0–5)
HEMATOCRIT: 41.8 % (ref 39.0–52.0)
HEMOGLOBIN: 14.3 g/dL (ref 13.0–17.0)
Lymphocytes Relative: 19 % (ref 12–46)
Lymphs Abs: 1.2 10*3/uL (ref 0.7–4.0)
MCH: 30.2 pg (ref 26.0–34.0)
MCHC: 34.2 g/dL (ref 30.0–36.0)
MCV: 88.4 fL (ref 78.0–100.0)
MPV: 10.5 fL (ref 8.6–12.4)
Monocytes Absolute: 0.6 10*3/uL (ref 0.1–1.0)
Monocytes Relative: 10 % (ref 3–12)
NEUTROS ABS: 4.1 10*3/uL (ref 1.7–7.7)
Neutrophils Relative %: 67 % (ref 43–77)
PLATELETS: 224 10*3/uL (ref 150–400)
RBC: 4.73 MIL/uL (ref 4.22–5.81)
RDW: 15.9 % — AB (ref 11.5–15.5)
WBC: 6.1 10*3/uL (ref 4.0–10.5)

## 2014-06-28 LAB — COMPLETE METABOLIC PANEL WITH GFR
ALBUMIN: 4 g/dL (ref 3.5–5.2)
ALT: 13 U/L (ref 0–53)
AST: 14 U/L (ref 0–37)
Alkaline Phosphatase: 78 U/L (ref 39–117)
BUN: 10 mg/dL (ref 6–23)
CALCIUM: 9 mg/dL (ref 8.4–10.5)
CO2: 24 mEq/L (ref 19–32)
CREATININE: 0.94 mg/dL (ref 0.50–1.35)
Chloride: 106 mEq/L (ref 96–112)
GFR, Est Non African American: >89 mL/min
GLUCOSE: 78 mg/dL (ref 70–99)
Potassium: 4.3 mEq/L (ref 3.5–5.3)
Sodium: 139 mEq/L (ref 135–145)
Total Bilirubin: 0.6 mg/dL (ref 0.2–1.2)
Total Protein: 6.4 g/dL (ref 6.0–8.3)

## 2014-06-28 LAB — GLUCOSE, CAPILLARY: GLUCOSE-CAPILLARY: 152 mg/dL — AB (ref 70–99)

## 2014-06-28 MED ORDER — METFORMIN HCL 1000 MG PO TABS: 1000.0000 mg | ORAL_TABLET | Freq: Two times a day (BID) | ORAL | Status: DC

## 2014-06-28 NOTE — Progress Notes (Signed)
Case discussed with Dr. Rabbani at the time of the visit.  We reviewed the resident's history and exam and pertinent patient test results.  I agree with the assessment, diagnosis, and plan of care documented in the resident's note. 

## 2014-06-28 NOTE — Patient Instructions (Addendum)
-  Great job on improving your A1c from 8.5 to 7.6 for your diabetes! -Start taking lamisil 250 mg daily for 12 weeks for your toenail fungus  -Increase your metformin from 500 mg daily to twice a day and then after 1 week take 1000 mg twice a day and keep taking your current insulin regimen  -Will give you stool cards and check your bloodwork -Please make an eye appt with Norm Parcelonna Plyler  -Will see you back in 3 months to recheck your diabetes   General Instructions:   Please bring your medicines with you each time you come to clinic.  Medicines may include prescription medications, over-the-counter medications, herbal remedies, eye drops, vitamins, or other pills.   Progress Toward Treatment Goals:  No flowsheet data found.  Self Care Goals & Plans:  Self Care Goal 11/09/2013  Manage my medications take my medicines as prescribed; bring my medications to every visit; refill my medications on time  Monitor my health -  Eat healthy foods eat foods that are low in salt; eat baked foods instead of fried foods  Be physically active find an activity I enjoy    Home Blood Glucose Monitoring 11/02/2012  Check my blood sugar 3 times a day  When to check my blood sugar before breakfast; at bedtime; before meals     Care Management & Community Referrals:  No flowsheet data found.

## 2014-06-29 LAB — HIV ANTIBODY (ROUTINE TESTING W REFLEX): HIV 1&2 Ab, 4th Generation: NONREACTIVE

## 2014-06-29 LAB — TSH: TSH: 0.57 u[IU]/mL (ref 0.350–4.500)

## 2014-06-29 NOTE — Assessment & Plan Note (Signed)
Assessment: Pt with history of PAF in 2006 with CHADS2 score of 1, compliant with AP therapy, who presents with normal cardiac rate and rhythm.   Plan:  -Continue 81 mg daily aspirin for stroke prevention  -Obtain TSH level ----> normal

## 2014-06-29 NOTE — Assessment & Plan Note (Addendum)
Assessment: Pt with last A1c on 11/09/13 compliant with insulin and oral hypoglycemic therapy with no symptomatic hypoglycemia who presents with CBG of 152 and improved A1c of 7.6.  Plan:  -A1c 7.6 not at goal <7.0, increase metformin 500 mg daily to BID for 1 week and then 1000 mg BID if tolerates, continue Humulin (70/30) 30 U AM & 27 U PM  -BP 139/82 at goal <140/90 not on anti-hypertensive therapy  -LDL 94 at goal < 100, continue pravastatin 40 mg daily  -Last annual urine microalbumin on 11/09/13 with no proteinuria  -Pt to schedule appt with Norm Parcelonna Plyler for retinal scanner, last eye exam on 06/08/13 was normal  -Last annual foot exam on 11/09/13  -BMI 26.51 not at goal <25, encourage weight loss

## 2014-06-29 NOTE — Assessment & Plan Note (Signed)
-  Pt declines annual influenza vaccination -Pt declines screening colonoscopy but agreeable to home stool card testing (cards and instructions given to pt) -Obtain annual HIV Ab ----> NR

## 2014-06-29 NOTE — Progress Notes (Signed)
Patient ID: Terry Castillo, male   DOB: 10-08-1965, 49 y.o.   MRN: [id number]    Subjective:   Patient ID: Terry Castillo male   DOB: 06-21-1965 49 y.o.   MRN: [id number]  HPI: Terry Castillo is a 49 y.o. very pleasant man with past medical history of paroxysmal atrial fibrillation not on AC therapy, insulin-dependent Type II DM, and hyperlipidemia who presents for follow-up visit of diabetes.    His A1c was 8.5 on 11/10/13. He reports he checks his blood sugars twice a day with average values in 100's. He forgot to bring his meter in today. He is compliant with taking insulin (70/30 Humulin) 30 U AM and 27 U PM. He is also compliant with taking metformin 500 mg daily which initially may have caused GI upset (started on lamisil at same time). He denies symptomatic hypoglycemia episodes, blurry vision, polyuria, polydipsia, polyphagia, neuropathy,or  foot injury/ulcer. He tries to follow a healthy diet and stay active. He has lost 3 lb since last visit in June.   He was prescribed lamisil at last visit for toe nail fungal infection which he reports taking a few pills and then having GI upset so he stopped taking them, however he also started taking metformin at that same time. He reports his left great toenail fell off awhile ago but none recently.    He has history of paroxysmal atrial fibrillation in 2006. He denies palpitations, chest pain, dyspnea, lightheadedeness, or syncope. He is compliant with taking 81 mg aspirin daily. He has never had his thyroid checked.   He declines screening colonoscopy but is agreeable to stool testing. He denies family history of colon cancer and dark/bright red stools. He has never had HIV testing. He declines flu shot.      Past Medical History  Diagnosis Date  . Onychomycosis   . Diabetes mellitus     uncontrolled  . Alcohol abuse   . Tobacco abuse   . Atrial fibrillation with RVR 2006    spontaneous conversion to sinus rhythm   Current Outpatient  Prescriptions  Medication Sig Dispense Refill  . aspirin 81 MG chewable tablet Chew 81 mg by mouth daily.      . Blood Glucose Monitoring Suppl (WAVESENSE AMP) W/DEVICE KIT Use to test blood glucose 3 times per day or as needed. 1 kit 0  . glucose blood (WAVESENSE PRESTO) test strip Use as instructed 100 each 12  . insulin NPH-regular Human (HUMULIN 70/30) (70-30) 100 UNIT/ML injection Inject 30 units SQ in the morning and 27 units SQ in the evening 10 mL 11  . Insulin Syringe-Needle U-100 (SM INSULIN SYRINGE) 31G X 5/16" 0.5 ML MISC Inject 1 Syringe into the skin 2 (two) times daily. Use with Novolog 7030 as instructed 2(twice) daiy 100 each 11  . Lancet Devices MISC Use to check blood sugar 3 times a day     . metFORMIN (GLUCOPHAGE) 1000 MG tablet Take 1 tablet (1,000 mg total) by mouth 2 (two) times daily with a meal. 60 tablet 11  . pravastatin (PRAVACHOL) 40 MG tablet Take 1 tablet (40 mg total) by mouth daily. 30 tablet 12  . terbinafine (LAMISIL) 250 MG tablet Take 1 tablet (250 mg total) by mouth daily. 90 tablet 0   No current facility-administered medications for this visit.   Family History  Problem Relation Age of Onset  . Heart disease Mother 59    Myocardial infarction  . Diabetes Mother   . Hypertension  Mother   . Cancer Father    History   Social History  . Marital Status: Legally Separated    Spouse Name: N/A    Number of Children: N/A  . Years of Education: 34   Social History Main Topics  . Smoking status: Current Every Day Smoker -- 0.25 packs/day    Types: Cigarettes  . Smokeless tobacco: None     Comment: started back  . Alcohol Use: No     Comment: Quit in 2009  . Drug Use: No  . Sexual Activity: None   Other Topics Concern  . None   Social History Narrative   Financial assistance approved for 100% discount at [facility] and has [facility] card per Bonna Gains October 3,2011 1:56PM      Dropped out of 11th grade in high school.   Used to work in [employer].   No family in [place] (2 sisters and a brother in [place])   Review of Systems: Review of Systems  Constitutional: Positive for weight loss (intentional). Negative for fever and chills.  HENT: Negative for congestion and sore throat.        Rhinorrhea  Eyes: Negative for blurred vision.  Respiratory: Negative for cough, shortness of breath and wheezing.   Cardiovascular: Negative for chest pain, palpitations and leg swelling.  Gastrointestinal: Negative for nausea, vomiting, abdominal pain, diarrhea, constipation, blood in stool and melena.  Genitourinary: Negative for dysuria, urgency, frequency and hematuria.  Skin:       Bilateral toenail fungus   Neurological: Negative for dizziness, sensory change and headaches.  Endo/Heme/Allergies: Negative for polydipsia.    Objective:  Physical Exam: Filed Vitals:   06/28/14 1421  BP: 139/82  Pulse: 86  Temp: 98.4 F (36.9 C)  TempSrc: Oral  Weight: 200 lb 14.4 oz (91.128 kg)  SpO2: 100%    Physical Exam  Constitutional: He is oriented to person, place, and time. He appears well-developed and well-nourished. No distress.  HENT:  Head: Normocephalic and atraumatic.  Right Ear: External ear normal.  Left Ear: External ear normal.  Nose: Nose normal.  Mouth/Throat: Oropharynx is clear and moist. No oropharyngeal exudate.  Eyes: Conjunctivae and EOM are normal. Pupils are equal, round, and reactive to light. Right eye exhibits no discharge. Left eye exhibits no discharge. No scleral icterus.  Bilateral red eyes  Neck: Normal range of motion. Neck supple.  Cardiovascular: Normal rate, regular rhythm and normal heart sounds.   No murmur heard. Pulmonary/Chest: Effort normal and breath sounds normal. No respiratory distress. He has no wheezes. He has no rales.  Abdominal: Soft. Bowel sounds are normal. He exhibits no distension. There is no tenderness. There is no rebound and no guarding.  Musculoskeletal: Normal  range of motion. He exhibits no edema or tenderness.  Neurological: He is alert and oriented to person, place, and time.  Skin: Skin is warm and dry. No rash noted. He is not diaphoretic. No erythema. No pallor.  Yellow and thickened toenails (1st & 2nd digits) bilaterally  Psychiatric: He has a normal mood and affect. His behavior is normal. Judgment and thought content normal.    Assessment & Plan:   Please see problem list for problem-based assessment and plan

## 2014-06-29 NOTE — Assessment & Plan Note (Addendum)
Assessment: Pt with chronic onychomycosis of bilateral toenails without response to topical anti-fungal therapy who presents with no recent toenail loss or signs of superimposed infection.   Plan:  -Obtain baseline CMP & CBC ----> normal  -Pt to re-start PO terbinafine for 12 weeks (GI upset was most likely due to starting metformin at the same time)

## 2014-07-22 ENCOUNTER — Telehealth: Payer: Self-pay | Admitting: Dietician

## 2014-07-22 NOTE — Telephone Encounter (Signed)
Call to patient to confirm appointment for 07/23/14 at 1:30. lmtcb ° °

## 2014-07-23 ENCOUNTER — Ambulatory Visit: Payer: Self-pay | Admitting: Dietician

## 2014-07-23 ENCOUNTER — Other Ambulatory Visit: Payer: Self-pay | Admitting: Dietician

## 2014-07-23 ENCOUNTER — Encounter: Payer: Self-pay | Admitting: Dietician

## 2014-07-23 DIAGNOSIS — Z794 Long term (current) use of insulin: Principal | ICD-10-CM

## 2014-07-23 DIAGNOSIS — E119 Type 2 diabetes mellitus without complications: Secondary | ICD-10-CM

## 2014-07-23 LAB — HM DIABETES EYE EXAM

## 2014-07-23 NOTE — Progress Notes (Signed)
Patient presented for retinal photos/images. This was done and they were transmitted.

## 2014-07-29 ENCOUNTER — Encounter: Payer: Self-pay | Admitting: *Deleted

## 2014-09-05 ENCOUNTER — Other Ambulatory Visit: Payer: Self-pay | Admitting: *Deleted

## 2014-09-05 DIAGNOSIS — B351 Tinea unguium: Secondary | ICD-10-CM

## 2014-09-06 MED ORDER — TERBINAFINE HCL 250 MG PO TABS: 250.0000 mg | ORAL_TABLET | Freq: Every day | ORAL | Status: DC

## 2014-09-09 NOTE — Telephone Encounter (Signed)
Rx called in to pharmacy - pt only uses packets of granduals  (125mg  each ). Pt does 2 packet daily on food. Given #180.

## 2014-09-27 ENCOUNTER — Encounter: Payer: Self-pay | Admitting: Internal Medicine

## 2014-10-21 ENCOUNTER — Other Ambulatory Visit: Payer: Self-pay | Admitting: *Deleted

## 2014-10-21 MED ORDER — INSULIN NPH ISOPHANE & REGULAR (70-30) 100 UNIT/ML ~~LOC~~ SUSP: SUBCUTANEOUS | Status: DC

## 2014-10-21 NOTE — Telephone Encounter (Signed)
Pt states he is out of insulin

## 2014-10-21 NOTE — Telephone Encounter (Signed)
Has June appt 

## 2014-10-21 NOTE — Telephone Encounter (Signed)
Rx'd 3 month supply with RF

## 2014-10-21 NOTE — Telephone Encounter (Signed)
Called to pharm 

## 2014-10-29 ENCOUNTER — Telehealth: Payer: Self-pay | Admitting: *Deleted

## 2014-10-29 NOTE — Telephone Encounter (Signed)
Sample of novolog 70/30 signed for.  Thanks.

## 2014-10-29 NOTE — Telephone Encounter (Signed)
Sample given of novolog 70/30, 1 vial, RU#[id number] exp: 11/2014 Instructions given verbally, this is a temporary change in your insulin, you are to continue your usual doses 30 units in the am and 27 units in the evening 15 mins before meals The vial was given to pt by dr Zada Girtkazibwe

## 2014-10-29 NOTE — Telephone Encounter (Signed)
Pt presents stating he has not had humulin 70/30 in 2 weeks , cbg' s average as stated by the pt are that they "run about 200", states he feels weak, tired. Called health dept pharm and they state that his insulin should be in any day and they have no samples, they did not know he was without insulin. Could we give him a vial from sample insulins? Does he need to be seen please advise?

## 2014-10-29 NOTE — Telephone Encounter (Signed)
Thank you!  Dr. Jarrett Albor 

## 2014-10-31 ENCOUNTER — Telehealth: Payer: Self-pay | Admitting: Internal Medicine

## 2014-10-31 NOTE — Telephone Encounter (Signed)
Call to patient to confirm appointment for 11/01/14 at 10:30 and 1:45 lmtcb

## 2014-11-01 ENCOUNTER — Ambulatory Visit (INDEPENDENT_AMBULATORY_CARE_PROVIDER_SITE_OTHER): Payer: Self-pay | Admitting: Internal Medicine

## 2014-11-01 ENCOUNTER — Ambulatory Visit: Payer: Self-pay

## 2014-11-01 VITALS — BP 144/81 | HR 93 | Temp 98.3°F | Ht 73.0 in | Wt 195.0 lb

## 2014-11-01 DIAGNOSIS — B351 Tinea unguium: Secondary | ICD-10-CM

## 2014-11-01 DIAGNOSIS — Z6825 Body mass index (BMI) 25.0-25.9, adult: Secondary | ICD-10-CM

## 2014-11-01 DIAGNOSIS — E119 Type 2 diabetes mellitus without complications: Secondary | ICD-10-CM

## 2014-11-01 DIAGNOSIS — Z7982 Long term (current) use of aspirin: Secondary | ICD-10-CM

## 2014-11-01 DIAGNOSIS — E785 Hyperlipidemia, unspecified: Secondary | ICD-10-CM

## 2014-11-01 DIAGNOSIS — Z87891 Personal history of nicotine dependence: Secondary | ICD-10-CM

## 2014-11-01 DIAGNOSIS — Z0000 Encounter for general adult medical examination without abnormal findings: Secondary | ICD-10-CM

## 2014-11-01 DIAGNOSIS — Z794 Long term (current) use of insulin: Secondary | ICD-10-CM

## 2014-11-01 LAB — GLUCOSE, CAPILLARY: Glucose-Capillary: 117 mg/dL — ABNORMAL HIGH (ref 65–99)

## 2014-11-01 LAB — POCT GLYCOSYLATED HEMOGLOBIN (HGB A1C): Hemoglobin A1C: 7.3

## 2014-11-01 MED ORDER — PRAVASTATIN SODIUM 40 MG PO TABS: 40.0000 mg | ORAL_TABLET | Freq: Every day | ORAL | Status: DC

## 2014-11-01 NOTE — Assessment & Plan Note (Signed)
Assesment: Patient with last lipid panel on 11/09/13 that was normal compliant with moderate-intensity statin therapy with 10-yr ASCVD of 9.6% with recommendations to continue moderate to high intensity statin therapy.    Plan:  -Obtain annual lipid panel -Continue pravastatin 40 mg daily  -CMP on 06/28/14 was normal -Continue to monitor for myalgias

## 2014-11-01 NOTE — Progress Notes (Signed)
Patient ID: Terry Castillo, male   DOB: 09/27/1965, 49 y.o.   MRN: [id number]    Subjective:   Patient ID: Terry Castillo male   DOB: 1965/08/24 49 y.o.   MRN: [id number]  HPI: [name] is a 49 y.o. very pleasant man with past medical history of paroxysmal atrial fibrillation not on AC therapy, insulin-dependent Type II DM, and hyperlipidemia who presents for follow-up visit of diabetes.   His A1c was 7.6 on 06/28/14. He checks his blood sugars twice a day with average values in 100's. He forgot to bring his meter in today. He is compliant with taking insulin (70/30 Humulin) 30 U AM and 25 U PM, however was supposed to be taking 27 U in PM. He is also compliant with taking metformin 1000 mg BID. He denies symptomatic hypoglycemia episodes, blurry vision, polyuria, polydipsia, polyphagia, neuropathy,or foot injury/ulcer. He tries to follow a healthy diet and stay active. He has lost 5 lb since last visit in January.   He is compliant with taking pravastatin daily with no myalgias.   He reports improvement of bilateral toenail fungal infection with PO lamisil which he took for 3 months.    He declines screening colonoscopy or stool testing.     Past Medical History  Diagnosis Date  . Onychomycosis   . Diabetes mellitus     uncontrolled  . Alcohol abuse   . Tobacco abuse   . Atrial fibrillation with RVR 2006    spontaneous conversion to sinus rhythm   Current Outpatient Prescriptions  Medication Sig Dispense Refill  . aspirin 81 MG chewable tablet Chew 81 mg by mouth daily.      . Blood Glucose Monitoring Suppl (WAVESENSE AMP) W/DEVICE KIT Use to test blood glucose 3 times per day or as needed. 1 kit 0  . glucose blood (WAVESENSE PRESTO) test strip Use as instructed 100 each 12  . insulin NPH-regular Human (HUMULIN 70/30) (70-30) 100 UNIT/ML injection Inject 30 units SQ in the morning and 27 units SQ in the evening 60 mL 3  . Insulin Syringe-Needle U-100 (SM INSULIN SYRINGE)  31G X 5/16" 0.5 ML MISC Inject 1 Syringe into the skin 2 (two) times daily. Use with Novolog 7030 as instructed 2(twice) daiy 100 each 11  . Lancet Devices MISC Use to check blood sugar 3 times a day     . metFORMIN (GLUCOPHAGE) 1000 MG tablet Take 1 tablet (1,000 mg total) by mouth 2 (two) times daily with a meal. 60 tablet 11  . pravastatin (PRAVACHOL) 40 MG tablet Take 1 tablet (40 mg total) by mouth daily. 30 tablet 12  . terbinafine (LAMISIL) 250 MG tablet Take 1 tablet (250 mg total) by mouth daily. 90 tablet 0   No current facility-administered medications for this visit.   Family History  Problem Relation Age of Onset  . Heart disease Mother 47    Myocardial infarction  . Diabetes Mother   . Hypertension Mother   . Cancer Father    History   Social History  . Marital Status: Legally Separated    Spouse Name: N/A  . Number of Children: N/A  . Years of Education: 11   Occupational History  .     Social History Main Topics  . Smoking status: Former Smoker -- 0.25 packs/day    Types: Cigarettes  . Smokeless tobacco: Not on file     Comment: started back  . Alcohol Use: No     Comment: Quit  in 2009  . Drug Use: No  . Sexual Activity: Not on file   Other Topics Concern  . Not on file   Social History Narrative   Financial assistance approved for 100% discount at [facility] and has [facility] card per Bonna Gains October 3,2011 1:56PM      Dropped out of 11th grade in high school.   Used to work in [employer].   No family in [place] (2 sisters and a brother in [place])   Review of Systems: Review of Systems  Constitutional: Positive for weight loss.  Eyes: Negative for blurred vision.  Cardiovascular: Negative for chest pain, palpitations and leg swelling.  Gastrointestinal: Negative for nausea, vomiting, abdominal pain, diarrhea, constipation, blood in stool and melena.  Genitourinary: Negative for dysuria, urgency, frequency and hematuria.    Musculoskeletal: Positive for myalgias.  Neurological: Negative for dizziness and headaches.  Endo/Heme/Allergies: Negative for polydipsia.    Objective:  Physical Exam: Filed Vitals:   11/01/14 1345  BP: 144/81  Pulse: 93  Temp: 98.3 F (36.8 C)  TempSrc: Oral  Height: 6' 1" (1.854 m)  Weight: 195 lb (88.451 kg)  SpO2: 99%   Physical Exam  Constitutional: He is oriented to person, place, and time. He appears well-developed and well-nourished. No distress.  HENT:  Head: Normocephalic and atraumatic.  Right Ear: External ear normal.  Left Ear: External ear normal.  Nose: Nose normal.  Mouth/Throat: Oropharynx is clear and moist. No oropharyngeal exudate.  Eyes: Conjunctivae and EOM are normal. Pupils are equal, round, and reactive to light. Right eye exhibits no discharge. Left eye exhibits no discharge. No scleral icterus.  Bilateral red eyes  Neck: Normal range of motion. Neck supple.  Cardiovascular: Normal rate, regular rhythm and normal heart sounds.   No murmur heard. Pulmonary/Chest: Effort normal and breath sounds normal. No respiratory distress. He has no wheezes. He has no rales.  Abdominal: Soft. Bowel sounds are normal. He exhibits no distension. There is no tenderness. There is no rebound and no guarding.  Musculoskeletal: Normal range of motion. He exhibits no edema or tenderness.  Neurological: He is alert and oriented to person, place, and time.  Skin: Skin is warm and dry. No rash noted. He is not diaphoretic. No erythema. No pallor.  Psychiatric: He has a normal mood and affect. His behavior is normal. Judgment and thought content normal.    Assessment & Plan:   Please see problem list for problem-based assessment and plan

## 2014-11-01 NOTE — Assessment & Plan Note (Addendum)
Assessment: Pt with chronic onychomycosis of bilateral toenails status post 3948-month therapy with oral lamisil therapy who presents with significant improvement.     Plan:  -Continue to monitor

## 2014-11-01 NOTE — Assessment & Plan Note (Signed)
Pt declines screening colonoscopy and stool card testing, will inquire again at next visit.

## 2014-11-01 NOTE — Patient Instructions (Addendum)
-  Your A1c is 7.3 which is near goal at 7, great job! Keep taking Humulin 30 U in the AM and 27 U PM and metformin 1000 mg twice a day -Will check your cholesterol today -Please think about getting a colonoscopy -Nice seeing you and glad you are doing well, will see you back in 3 months   General Instructions:   Please bring your medicines with you each time you come to clinic.  Medicines may include prescription medications, over-the-counter medications, herbal remedies, eye drops, vitamins, or other pills.   Progress Toward Treatment Goals:  No flowsheet data found.  Self Care Goals & Plans:  Self Care Goal 11/09/2013  Manage my medications take my medicines as prescribed; bring my medications to every visit; refill my medications on time  Monitor my health -  Eat healthy foods eat foods that are low in salt; eat baked foods instead of fried foods  Be physically active find an activity I enjoy    Home Blood Glucose Monitoring 11/02/2012  Check my blood sugar 3 times a day  When to check my blood sugar before breakfast; at bedtime; before meals     Care Management & Community Referrals:  No flowsheet data found.

## 2014-11-01 NOTE — Assessment & Plan Note (Addendum)
Assessment: Pt with last A1c of 7.6 on 06/28/14 compliant with insulin and oral hypoglycemic therapy with no symptomatic hypoglycemia who presents with CBG of 117 and improved A1c of 7.3.  Plan:  -A1c 7.3 not at goal <7.0, continue metformin 1000 mg BID and Humulin (70/30) 30 U AM & instructed to increase PM dose from 25 U to 27 U as previously instructed   -BP 144/81 near goal <140/90 not on anti-hypertensive therapy  -Obtain lipid panel, last LDL 94 at goal < 100, continue pravastatin 40 mg daily  -Last annual urine microalbumin on 11/09/13 with no proteinuria, repeat at next visit   -Last annual eye exam on 07/23/14 with no retinopathy  -Last annual foot exam on 11/09/13, repeat at next visit   -BMI 25.73 not at goal <25, encourage weight loss

## 2014-11-02 LAB — LIPID PANEL
CHOLESTEROL: 202 mg/dL — AB (ref 0–200)
HDL: 60 mg/dL (ref >=40)
LDL Cholesterol: 123 mg/dL — ABNORMAL HIGH (ref 0–99)
TRIGLYCERIDES: 96 mg/dL (ref <150)
Total CHOL/HDL Ratio: 3.4 Ratio
VLDL: 19 mg/dL (ref 0–40)

## 2014-11-04 ENCOUNTER — Ambulatory Visit: Payer: Self-pay

## 2014-11-05 NOTE — Progress Notes (Signed)
Internal Medicine Clinic Attending  Case discussed with Dr. Rabbani soon after the resident saw the patient.  We reviewed the resident's history and exam and pertinent patient test results.  I agree with the assessment, diagnosis, and plan of care documented in the resident's note.  

## 2014-11-19 ENCOUNTER — Telehealth: Payer: Self-pay | Admitting: *Deleted

## 2014-11-19 NOTE — Telephone Encounter (Signed)
Pt walked in to clinic - [facility] does not have Humulin 70/30. Talked to Tressa at [facility] and states his paperwork is in Lily's hand being process. Pt states he has current [employer] and $6.00 for Humulin 70/30. Pt aware he needs to go to [facility] to purchase insulin till [facility] gets response from [place] [facility]. Pt states he understood. Stanton Kidney Genieve Ramaswamy RN 11/19/14 9AM

## 2015-04-09 ENCOUNTER — Ambulatory Visit: Payer: Self-pay

## 2015-05-27 ENCOUNTER — Emergency Department (HOSPITAL_COMMUNITY)
Admission: EM | Admit: 2015-05-27 | Discharge: 2015-05-27 | Disposition: A | Discharge status: Home / Self Care | Payer: Self-pay | Attending: Emergency Medicine | Admitting: Emergency Medicine

## 2015-05-27 ENCOUNTER — Encounter (HOSPITAL_COMMUNITY): Payer: Self-pay | Admitting: Emergency Medicine

## 2015-05-27 DIAGNOSIS — Z794 Long term (current) use of insulin: Secondary | ICD-10-CM | POA: Insufficient documentation

## 2015-05-27 DIAGNOSIS — E119 Type 2 diabetes mellitus without complications: Secondary | ICD-10-CM | POA: Insufficient documentation

## 2015-05-27 DIAGNOSIS — Z79899 Other long term (current) drug therapy: Secondary | ICD-10-CM | POA: Insufficient documentation

## 2015-05-27 DIAGNOSIS — Z87891 Personal history of nicotine dependence: Secondary | ICD-10-CM | POA: Insufficient documentation

## 2015-05-27 DIAGNOSIS — Z8619 Personal history of other infectious and parasitic diseases: Secondary | ICD-10-CM | POA: Insufficient documentation

## 2015-05-27 DIAGNOSIS — Z7982 Long term (current) use of aspirin: Secondary | ICD-10-CM | POA: Insufficient documentation

## 2015-05-27 DIAGNOSIS — L02412 Cutaneous abscess of left axilla: Secondary | ICD-10-CM

## 2015-05-27 DIAGNOSIS — Z8679 Personal history of other diseases of the circulatory system: Secondary | ICD-10-CM | POA: Insufficient documentation

## 2015-05-27 MED ORDER — SULFAMETHOXAZOLE-TRIMETHOPRIM 800-160 MG PO TABS: 1.0000 | ORAL_TABLET | Freq: Two times a day (BID) | ORAL | Status: AC

## 2015-05-27 MED ORDER — LIDOCAINE-EPINEPHRINE (PF) 2 %-1:200000 IJ SOLN
20.0000 mL | Freq: Once | INTRAMUSCULAR | Status: AC
  Administered 2015-05-27: 20 mL
  Filled 2015-05-27: qty 20

## 2015-05-27 MED ORDER — HYDROCODONE-ACETAMINOPHEN 5-325 MG PO TABS: 2.0000 | ORAL_TABLET | ORAL | Status: DC | PRN

## 2015-05-27 NOTE — Discharge Instructions (Signed)
Abscess °An abscess is an infected area that contains a collection of pus and debris. It can occur in almost any part of the body. An abscess is also known as a furuncle or boil. °CAUSES  °An abscess occurs when tissue gets infected. This can occur from blockage of oil or sweat glands, infection of hair follicles, or a minor injury to the skin. As the body tries to fight the infection, pus collects in the area and creates pressure under the skin. This pressure causes pain. People with weakened immune systems have difficulty fighting infections and get certain abscesses more often.  °SYMPTOMS °Usually an abscess develops on the skin and becomes a painful mass that is red, warm, and tender. If the abscess forms under the skin, you may feel a moveable soft area under the skin. Some abscesses break open (rupture) on their own, but most will continue to get worse without care. The infection can spread deeper into the body and eventually into the bloodstream, causing you to feel ill.  °DIAGNOSIS  °Your caregiver will take your medical history and perform a physical exam. A sample of fluid may also be taken from the abscess to determine what is causing your infection. °TREATMENT  °Your caregiver may prescribe antibiotic medicines to fight the infection. However, taking antibiotics alone usually does not cure an abscess. Your caregiver may need to make a small cut (incision) in the abscess to drain the pus. In some cases, gauze is packed into the abscess to reduce pain and to continue draining the area. °HOME CARE INSTRUCTIONS  °· Only take over-the-counter or prescription medicines for pain, discomfort, or fever as directed by your caregiver. °· If you were prescribed antibiotics, take them as directed. Finish them even if you start to feel better. °· If gauze is used, follow your caregiver's directions for changing the gauze. °· To avoid spreading the infection: °· Keep your draining abscess covered with a  bandage. °· Wash your hands well. °· Do not share personal care items, towels, or whirlpools with others. °· Avoid skin contact with others. °· Keep your skin and clothes clean around the abscess. °· Keep all follow-up appointments as directed by your caregiver. °SEEK MEDICAL CARE IF:  °· You have increased pain, swelling, redness, fluid drainage, or bleeding. °· You have muscle aches, chills, or a general ill feeling. °· You have a fever. °MAKE SURE YOU:  °· Understand these instructions. °· Will watch your condition. °· Will get help right away if you are not doing well or get worse. °  °This information is not intended to replace advice given to you by your health care provider. Make sure you discuss any questions you have with your health care provider. °  °Document Released: 02/24/2005 Document Revised: 11/16/2011 Document Reviewed: 07/30/2011 °Elsevier Interactive Patient Education ©2016 Elsevier Inc[employer] ° °Incision and Drainage °Incision and drainage is a procedure in which a sac-like structure (cystic structure) is opened and drained. The area to be drained usually contains material such as pus, fluid, or blood.  °LET YOUR CAREGIVER KNOW ABOUT:  °· Allergies to medicine. °· Medicines taken, including vitamins, herbs, eyedrops, over-the-counter medicines, and creams. °· Use of steroids (by mouth or creams). °· Previous problems with anesthetics or numbing medicines. °· History of bleeding problems or blood clots. °· Previous surgery. °· Other health problems, including diabetes and kidney problems. °· Possibility of pregnancy, if this applies. °RISKS AND COMPLICATIONS °· Pain. °· Bleeding. °· Scarring. °· Infection. °BEFORE THE PROCEDURE  °  You may need to have an ultrasound or other imaging tests to see how large or deep your cystic structure is. Blood tests may also be used to determine if you have an infection or how severe the infection is. You may need to have a tetanus shot. PROCEDURE  The affected area  is cleaned with a cleaning fluid. The cyst area will then be numbed with a medicine (local anesthetic). A small incision will be made in the cystic structure. A syringe or catheter may be used to drain the contents of the cystic structure, or the contents may be squeezed out. The area will then be flushed with a cleansing solution. After cleansing the area, it is often gently packed with a gauze or another wound dressing. Once it is packed, it will be covered with gauze and tape or some other type of wound dressing. AFTER THE PROCEDURE   Often, you will be allowed to go home right after the procedure.  You may be given antibiotic medicine to prevent or heal an infection.  If the area was packed with gauze or some other wound dressing, you will likely need to come back in 1 to 2 days to get it removed.  The area should heal in about 14 days.   This information is not intended to replace advice given to you by your health care provider. Make sure you discuss any questions you have with your health care provider.   Follow-up in this department in 3 days for a wound recheck. Take antibiotics as prescribed. Return to the emergency department sooner if you experience fever, increased swelling or redness around the affected area. May wash with soap and water.

## 2015-05-27 NOTE — ED Notes (Signed)
PA at bedside to perform I&D

## 2015-05-27 NOTE — ED Notes (Signed)
Abscess under L arm.   Patient states "it popped last night.   I don't think I got it all".  Denies other symptoms.

## 2015-05-27 NOTE — ED Notes (Signed)
See PA assessment 

## 2015-05-27 NOTE — ED Provider Notes (Signed)
CSN: [id number]     Arrival date & time 05/27/15  1020 History  By signing my name below, I, Stephania Fragmin, attest that this documentation has been prepared under the direction and in the presence of Samantha Dowless, PA-C. Electronically Signed: Stephania Fragmin, ED Scribe. 05/27/2015. 11:56 AM.    Chief Complaint  Patient presents with  . Abscess    The history is provided by the patient. No language interpreter was used.    HPI Comments: Terry Castillo is a 49 y.o. male with a history of DM, who presents to the Emergency Department complaining of a recurrent, gradual-onset, constant, worsening, a localized area of pain and swelling underneath his left axilla that began 1.5 weeks ago. He states the area had been larger but had spontaneously popped last night, and he had squeezed it out this morning - he states "I don't think I got it all." Patient states he had an abscess in the same spot around the same time last year.He denies any changes in deodorants ora ny other known possible triggers for his symptoms. He states he takes insulin daily. He denies fever or inguinal swelling. Patient reports he is a <1 PPD smoker.   Past Medical History  Diagnosis Date  . Onychomycosis   . Diabetes mellitus     uncontrolled  . Alcohol abuse   . Tobacco abuse   . Atrial fibrillation with RVR 2006    spontaneous conversion to sinus rhythm   No past surgical history on file. Family History  Problem Relation Age of Onset  . Heart disease Mother 34    Myocardial infarction  . Diabetes Mother   . Hypertension Mother   . Cancer Father    Social History  Substance Use Topics  . Smoking status: Former Smoker -- 0.25 packs/day    Types: Cigarettes  . Smokeless tobacco: Not on file     Comment: started back  . Alcohol Use: No     Comment: Quit in 2009    Review of Systems  Constitutional: Negative for fever.  Skin:       +localized area of pain and swelling to left axilla   Allergies  Review of  patient's allergies indicates no known allergies.  Home Medications   Prior to Admission medications   Medication Sig Start Date End Date Taking? Authorizing Provider  aspirin 81 MG chewable tablet Chew 81 mg by mouth daily.      Historical Provider, MD  Blood Glucose Monitoring Suppl (WAVESENSE AMP) W/DEVICE KIT Use to test blood glucose 3 times per day or as needed. 11/02/12   Neta Ehlers, MD  glucose blood (WAVESENSE PRESTO) test strip Use as instructed 11/02/12   Neta Ehlers, MD  insulin NPH-regular Human (HUMULIN 70/30) (70-30) 100 UNIT/ML injection Inject 30 units SQ in the morning and 27 units SQ in the evening 10/21/14   Bartholomew Crews, MD  Insulin Syringe-Needle U-100 (SM INSULIN SYRINGE) 31G X 5/16" 0.5 ML MISC Inject 1 Syringe into the skin 2 (two) times daily. Use with Novolog 7030 as instructed 2(twice) daiy 11/09/13   Juluis Mire, MD  Lancet Devices MISC Use to check blood sugar 3 times a day     Historical Provider, MD  metFORMIN (GLUCOPHAGE) 1000 MG tablet Take 1 tablet (1,000 mg total) by mouth 2 (two) times daily with a meal. 06/28/14 06/28/15  Juluis Mire, MD  pravastatin (PRAVACHOL) 40 MG tablet Take 1 tablet (40 mg total) by mouth daily. 11/01/14  Marjan Rabbani, MD   BP 154/88 mmHg  Pulse 95  Temp(Src) 98.5 F (36.9 C) (Oral)  Resp 20  SpO2 97% Physical Exam  Constitutional: He is oriented to person, place, and time. He appears well-developed and well-nourished. No distress.  HENT:  Head: Normocephalic and atraumatic.  Eyes: Conjunctivae are normal. Right eye exhibits no discharge. Left eye exhibits no discharge. No scleral icterus.  Cardiovascular: Normal rate.   Pulmonary/Chest: Effort normal.  Neurological: He is alert and oriented to person, place, and time. Coordination normal.  Skin: Skin is warm and dry. No rash noted. He is not diaphoretic. No erythema. No pallor.  4cmx2cm fluctuant abscess in left axilla. Minimal surrounding erythema.   Psychiatric:  He has a normal mood and affect. His behavior is normal.  Nursing note and vitals reviewed.   ED Course  Procedures (including critical care time)  DIAGNOSTIC STUDIES: Oxygen Saturation is 97% on RA, normal by my interpretation.    COORDINATION OF CARE: 11:05 AM - Discussed treatment plan with pt at bedside which includes I&D. Pt verbalized understanding and agreed to plan.   INCISION AND DRAINAGE PROCEDURE NOTE: Patient identification was confirmed and verbal consent was obtained. This procedure was performed by Donnald Garre, PA-C, at 11:44 AM. Site: Left axilla Sterile procedures observed Needle size: 25G Anesthetic used (type and amt): Lidocaine 2% with epi, 10 mL Blade size: 11 Drainage: Copious, purulent Complexity: Complex Packing used Site anesthetized, incision made over site, wound drained and explored loculations, rinsed with copious amounts of normal saline, wound packed with 1/4 iodoform, covered with dry, sterile dressing.  Pt tolerated procedure well without complications.  Instructions for care discussed verbally and pt provided with additional written instructions for homecare and f/u.   MDM   Final diagnoses:  Abscess of axilla, left     Patient with skin abscess amenable to incision and drainage.  Wound packed with 1/4" iodoform. Encouraged home warm soaks and flushing.  Mild signs of cellulitis is surrounding skin.  Will d/c to home.  Pt placed on Bactrim. Pt will have wound checked in 2-3 days. Return precautions outlined in patient discharge instructions. Pt stable for discharge.    I personally performed the services described in this documentation, which was scribed in my presence. The recorded information has been reviewed and is accurate.      [place], PA-C 05/28/15 2694  Lajean Saver, MD 05/29/15 [phone]

## 2015-05-30 ENCOUNTER — Encounter (HOSPITAL_COMMUNITY): Payer: Self-pay

## 2015-05-30 ENCOUNTER — Emergency Department (HOSPITAL_COMMUNITY)
Admission: EM | Admit: 2015-05-30 | Discharge: 2015-05-30 | Disposition: A | Discharge status: Home / Self Care | Payer: Self-pay | Attending: Emergency Medicine | Admitting: Emergency Medicine

## 2015-05-30 DIAGNOSIS — Z7982 Long term (current) use of aspirin: Secondary | ICD-10-CM | POA: Insufficient documentation

## 2015-05-30 DIAGNOSIS — Z4801 Encounter for change or removal of surgical wound dressing: Secondary | ICD-10-CM | POA: Insufficient documentation

## 2015-05-30 DIAGNOSIS — Z794 Long term (current) use of insulin: Secondary | ICD-10-CM | POA: Insufficient documentation

## 2015-05-30 DIAGNOSIS — E119 Type 2 diabetes mellitus without complications: Secondary | ICD-10-CM | POA: Insufficient documentation

## 2015-05-30 DIAGNOSIS — Z7984 Long term (current) use of oral hypoglycemic drugs: Secondary | ICD-10-CM | POA: Insufficient documentation

## 2015-05-30 DIAGNOSIS — Z87891 Personal history of nicotine dependence: Secondary | ICD-10-CM | POA: Insufficient documentation

## 2015-05-30 DIAGNOSIS — Z8679 Personal history of other diseases of the circulatory system: Secondary | ICD-10-CM | POA: Insufficient documentation

## 2015-05-30 DIAGNOSIS — Z09 Encounter for follow-up examination after completed treatment for conditions other than malignant neoplasm: Secondary | ICD-10-CM

## 2015-05-30 DIAGNOSIS — Z8619 Personal history of other infectious and parasitic diseases: Secondary | ICD-10-CM | POA: Insufficient documentation

## 2015-05-30 DIAGNOSIS — Z792 Long term (current) use of antibiotics: Secondary | ICD-10-CM | POA: Insufficient documentation

## 2015-05-30 MED ORDER — CEPHALEXIN 500 MG PO CAPS: 500.0000 mg | ORAL_CAPSULE | Freq: Four times a day (QID) | ORAL | Status: DC

## 2015-05-30 NOTE — ED Provider Notes (Signed)
CSN: [id number]     Arrival date & time 05/30/15  8453 History  By signing my name below, I, Terressa Koyanagi, attest that this documentation has been prepared under the direction and in the presence of Glendell Docker, NP. Electronically Signed: Terressa Koyanagi, ED Scribe. 05/30/2015. 10:20 AM.  Chief Complaint  Patient presents with  . Wound Check   The history is provided by the patient. No language interpreter was used.     PCP: Juluis Mire, MD HPI Comments: Terry Castillo is a 49 y.o. male, with PMHx noted below including uncontrolled DM (pt reports his glucose levels have been in the 100s at home), who presents to the Emergency Department for recheck of an abscess underneath the left axilla onset 1.7 weeks ago. Pt was seen for the same at the ED on 05/27/15 whereby an I&D was completed, wound was packed with 1/4" iodoform and pt was started on Bactrim. Pt continues to complain of pain and drainage from abscess site.   Past Medical History  Diagnosis Date  . Onychomycosis   . Diabetes mellitus     uncontrolled  . Alcohol abuse   . Tobacco abuse   . Atrial fibrillation with RVR ([facility]) 2006    spontaneous conversion to sinus rhythm   History reviewed. No pertinent past surgical history. Family History  Problem Relation Age of Onset  . Heart disease Mother 17    Myocardial infarction  . Diabetes Mother   . Hypertension Mother   . Cancer Father    Social History  Substance Use Topics  . Smoking status: Former Smoker -- 0.25 packs/day    Types: Cigarettes  . Smokeless tobacco: None     Comment: started back  . Alcohol Use: No     Comment: Quit in 2009    Review of Systems  Constitutional: Negative for fever.  Skin: Positive for wound (abscess under left axilla with associated pain and drainage).  All other systems reviewed and are negative.     Allergies  Review of patient's allergies indicates no known allergies.  Home Medications   Prior to Admission  medications   Medication Sig Start Date End Date Taking? Authorizing Provider  aspirin 81 MG chewable tablet Chew 81 mg by mouth daily.     Yes Historical Provider, MD  Blood Glucose Monitoring Suppl (WAVESENSE AMP) W/DEVICE KIT Use to test blood glucose 3 times per day or as needed. 11/02/12  Yes Neta Ehlers, MD  glucose blood (WAVESENSE PRESTO) test strip Use as instructed 11/02/12  Yes Neta Ehlers, MD  insulin NPH-regular Human (HUMULIN 70/30) (70-30) 100 UNIT/ML injection Inject 30 units SQ in the morning and 27 units SQ in the evening 10/21/14  Yes Bartholomew Crews, MD  Insulin Syringe-Needle U-100 (SM INSULIN SYRINGE) 31G X 5/16" 0.5 ML MISC Inject 1 Syringe into the skin 2 (two) times daily. Use with Novolog 7030 as instructed 2(twice) daiy 11/09/13  Yes Juluis Mire, MD  Lancet Devices MISC Use to check blood sugar 3 times a day    Yes Historical Provider, MD  metFORMIN (GLUCOPHAGE) 1000 MG tablet Take 1 tablet (1,000 mg total) by mouth 2 (two) times daily with a meal. 06/28/14 06/28/15 Yes Marjan Rabbani, MD  pravastatin (PRAVACHOL) 40 MG tablet Take 1 tablet (40 mg total) by mouth daily. 11/01/14  Yes Juluis Mire, MD  sulfamethoxazole-trimethoprim (BACTRIM DS,SEPTRA DS) 800-160 MG tablet Take 1 tablet by mouth 2 (two) times daily. 05/27/15 06/03/15 Yes Samantha Tripp Dowless, PA-C  cephALEXin (KEFLEX) 500 MG capsule Take 1 capsule (500 mg total) by mouth 4 (four) times daily. 05/30/15   Glendell Docker, NP  HYDROcodone-acetaminophen (NORCO/VICODIN) 5-325 MG tablet Take 2 tablets by mouth every 4 (four) hours as needed. 05/27/15   Samantha Tripp Dowless, PA-C   Triage Vitals: BP 151/84 mmHg  Pulse 101  Temp(Src) 98.1 F (36.7 C) (Oral)  Resp 16  Ht _0  (1.854 m)  Wt 190 lb (86.183 kg)  BMI 25.07 kg/m2  SpO2 95% Physical Exam  Constitutional: He is oriented to person, place, and time. He appears well-developed and well-nourished.  HENT:  Head: Normocephalic.  Eyes: EOM are  normal.  Neck: Normal range of motion.  Pulmonary/Chest: Effort normal.  Abdominal: He exhibits no distension.  Musculoskeletal: Normal range of motion.  Neurological: He is alert and oriented to person, place, and time.  Skin:  Mild drainage and fullness noted. Packing noted  Psychiatric: He has a normal mood and affect.  Nursing note and vitals reviewed.   ED Course  Procedures (including critical care time) DIAGNOSTIC STUDIES: Oxygen Saturation is 95% on ra, nl by my interpretation.    COORDINATION OF CARE: 10:15 AM: Discussed treatment plan which includes keflex and f/u in 2 days with pt at bedside; patient verbalizes understanding and agrees with treatment plan.  MDM   Final diagnoses:  Encounter for recheck of abscess following incision and drainage    Added keflex and pt told to come back:packing partially removed.  I personally performed the services described in this documentation, which was scribed in my presence. The recorded information has been reviewed and is accurate.    Glendell Docker, NP 05/30/15 [place], MD 05/30/15 [phone]

## 2015-05-30 NOTE — ED Notes (Signed)
Pt. Presents for reevaluation of boil to L axillary area.

## 2015-05-30 NOTE — ED Notes (Signed)
See provider note for assessment

## 2015-09-04 ENCOUNTER — Telehealth: Payer: Self-pay | Admitting: Internal Medicine

## 2015-09-04 NOTE — Telephone Encounter (Signed)
APPT. REMINDER CALL, LMTCB °

## 2015-09-05 ENCOUNTER — Encounter: Payer: Self-pay | Admitting: Internal Medicine

## 2015-09-05 ENCOUNTER — Ambulatory Visit (INDEPENDENT_AMBULATORY_CARE_PROVIDER_SITE_OTHER): Payer: Self-pay | Admitting: Internal Medicine

## 2015-09-05 VITALS — BP 147/84 | HR 84 | Temp 98.0°F | Wt 189.0 lb

## 2015-09-05 DIAGNOSIS — Z794 Long term (current) use of insulin: Secondary | ICD-10-CM

## 2015-09-05 DIAGNOSIS — Z0000 Encounter for general adult medical examination without abnormal findings: Secondary | ICD-10-CM

## 2015-09-05 DIAGNOSIS — E1165 Type 2 diabetes mellitus with hyperglycemia: Secondary | ICD-10-CM

## 2015-09-05 DIAGNOSIS — Z79899 Other long term (current) drug therapy: Secondary | ICD-10-CM

## 2015-09-05 DIAGNOSIS — I10 Essential (primary) hypertension: Secondary | ICD-10-CM

## 2015-09-05 DIAGNOSIS — E119 Type 2 diabetes mellitus without complications: Secondary | ICD-10-CM

## 2015-09-05 LAB — POCT GLYCOSYLATED HEMOGLOBIN (HGB A1C): HEMOGLOBIN A1C: 7.6

## 2015-09-05 LAB — GLUCOSE, CAPILLARY: Glucose-Capillary: 185 mg/dL — ABNORMAL HIGH (ref 65–99)

## 2015-09-05 MED ORDER — INSULIN NPH ISOPHANE & REGULAR (70-30) 100 UNIT/ML ~~LOC~~ SUSP: SUBCUTANEOUS | Status: DC

## 2015-09-05 MED ORDER — HYDROCHLOROTHIAZIDE 12.5 MG PO TABS: 12.5000 mg | ORAL_TABLET | Freq: Every day | ORAL | Status: DC

## 2015-09-05 MED ORDER — METFORMIN HCL 1000 MG PO TABS: 1000.0000 mg | ORAL_TABLET | Freq: Two times a day (BID) | ORAL | Status: DC

## 2015-09-05 NOTE — Assessment & Plan Note (Addendum)
Assessment: Pt with last A1c of 7.3 on 11/01/14 compliant with insulin and oral hypoglycemic therapy with no symptomatic hypoglycemia who presents with CBG of 185 and mildly worsened A1c of 7.6.  Plan:  -A1c 7.6 not at goal <7.0, continue metformin 1000 mg BID and increase Humulin (70/30) 30 U AM / 27 U PM to 30 U BID due to morning hyperglycemia   -BP 147/84 not at goal <140/90, start HCTZ 12.5 mg daily   -LDL 123 not at goal < 100, continue pravastatin 40 mg daily, consider changing to high intensity atorvastatin 40 mg daily due to 10 yr ASCVD risk >7.5% at next visit   -Obtain annual urine microalbumin   -Last annual eye exam on 07/23/14 with no retinopathy, pt to return for examination   -Perform annual foot exam today -BMI 24.94 at goal <25, maintain weight control

## 2015-09-05 NOTE — Patient Instructions (Addendum)
-  Your A1c is 7.6 from 7.3 last time (goal is <7), please increase your insulin to 30 U twice a day and keep taking metformin. Also keep checking your blood sugars. -Start taking HCTZ 12.5 mg daily for high blood pressure, will send this to [facility] -Please come back for an eye exam with Lupita LeashDonna  -Will check your bloodwork next time -Glad you are doing well, please come back in 3 months   General Instructions:   Please bring your medicines with you each time you come to clinic.  Medicines may include prescription medications, over-the-counter medications, herbal remedies, eye drops, vitamins, or other pills.   Progress Toward Treatment Goals:  No flowsheet data found.  Self Care Goals & Plans:  Self Care Goal 11/09/2013  Manage my medications take my medicines as prescribed; bring my medications to every visit; refill my medications on time  Monitor my health -  Eat healthy foods eat foods that are low in salt; eat baked foods instead of fried foods  Be physically active find an activity I enjoy    Home Blood Glucose Monitoring 11/02/2012  Check my blood sugar 3 times a day  When to check my blood sugar before breakfast; at bedtime; before meals     Care Management & Community Referrals:  No flowsheet data found.

## 2015-09-05 NOTE — Assessment & Plan Note (Signed)
-  Obtain screening HCV at next visit  -Pt declined screening colonoscopy and annual stool card testing -Pt declined tdap vaccination

## 2015-09-05 NOTE — Progress Notes (Signed)
Patient ID: Terry Castillo, male   DOB: 10/30/1965, 50 y.o.   MRN: [id number]    Subjective:   Patient ID: Terry Castillo male   DOB: 1965/09/25 50 y.o.   MRN: [id number]  HPI: [name] is a 50 y.o. very pleasant man with past medical history of paroxysmal atrial fibrillation on ASA, insulin-dependent Type II DM, hypertension, and hyperlipidemia who presents for follow-up visit of diabetes.   His A1c was 7.3 on 11/01/14. He checks his blood sugars twice a day with average values in 100's and worse in the morning. He unfortunately forgot to bring his meter in today. He is compliant with taking insulin (70/30 Humulin) 30 U AM /27 U PM and metformin 1000 mg BID. He denies symptomatic hypoglycemia episodes, blurry vision, polyuria, polydipsia, polyphagia, neuropathy ,or foot injury/ulcer. He tries to follow a healthy diet and stay active. He has lost 6 lb since last visit in 10 months ago.   He is compliant with taking pravastatin for hyperlipidemia with no myalgias.   His blood pressure has been elevated for the past 3 visits and is not on anti-hypertensive therapy.     Past Medical History  Diagnosis Date  . Onychomycosis   . Diabetes mellitus     uncontrolled  . Alcohol abuse   . Tobacco abuse   . Atrial fibrillation with RVR ([facility]) 2006    spontaneous conversion to sinus rhythm   Current Outpatient Prescriptions  Medication Sig Dispense Refill  . aspirin 81 MG chewable tablet Chew 81 mg by mouth daily.      . Blood Glucose Monitoring Suppl (WAVESENSE AMP) W/DEVICE KIT Use to test blood glucose 3 times per day or as needed. 1 kit 0  . cephALEXin (KEFLEX) 500 MG capsule Take 1 capsule (500 mg total) by mouth 4 (four) times daily. 28 capsule 0  . glucose blood (WAVESENSE PRESTO) test strip Use as instructed 100 each 12  . HYDROcodone-acetaminophen (NORCO/VICODIN) 5-325 MG tablet Take 2 tablets by mouth every 4 (four) hours as needed. 6 tablet 0  . insulin NPH-regular Human  (HUMULIN 70/30) (70-30) 100 UNIT/ML injection Inject 30 units SQ in the morning and 27 units SQ in the evening 60 mL 3  . Insulin Syringe-Needle U-100 (SM INSULIN SYRINGE) 31G X 5/16" 0.5 ML MISC Inject 1 Syringe into the skin 2 (two) times daily. Use with Novolog 7030 as instructed 2(twice) daiy 100 each 11  . Lancet Devices MISC Use to check blood sugar 3 times a day     . metFORMIN (GLUCOPHAGE) 1000 MG tablet Take 1 tablet (1,000 mg total) by mouth 2 (two) times daily with a meal. 60 tablet 11  . pravastatin (PRAVACHOL) 40 MG tablet Take 1 tablet (40 mg total) by mouth daily. 30 tablet 11   No current facility-administered medications for this visit.   Family History  Problem Relation Age of Onset  . Heart disease Mother 29    Myocardial infarction  . Diabetes Mother   . Hypertension Mother   . Cancer Father    Social History   Social History  . Marital Status: Legally Separated    Spouse Name: N/A  . Number of Children: N/A  . Years of Education: 11   Occupational History  .     Social History Main Topics  . Smoking status: Current Every Day Smoker -- 0.25 packs/day    Types: Cigarettes  . Smokeless tobacco: None     Comment: started back  .  Alcohol Use: No     Comment: Quit in 2009  . Drug Use: No  . Sexual Activity: Not Asked   Other Topics Concern  . None   Social History Narrative   Financial assistance approved for 100% discount at [facility] and has [facility] card per Bonna Gains October 3,2011 1:56PM      Dropped out of 11th grade in high school.   Used to work in [employer].   No family in [place] (2 sisters and a brother in [place])   Review of Systems: Review of Systems  Constitutional: Negative for fever and chills.  Eyes: Negative for blurred vision.  Respiratory: Negative for cough, shortness of breath and wheezing.   Cardiovascular: Negative for chest pain and leg swelling.  Gastrointestinal: Negative for nausea, vomiting, abdominal pain,  diarrhea, constipation and blood in stool.  Genitourinary: Negative for dysuria, urgency, frequency and hematuria.  Musculoskeletal: Negative for myalgias.  Neurological: Negative for dizziness, sensory change and headaches.  Endo/Heme/Allergies: Negative for environmental allergies and polydipsia.     Objective:  Physical Exam: Filed Vitals:   09/05/15 1455  BP: 147/84  Pulse: 84  Temp: 98 F (36.7 C)  TempSrc: Oral  Weight: 189 lb (85.73 kg)  SpO2: 100%    Physical Exam  Constitutional: He is oriented to person, place, and time. He appears well-developed and well-nourished. No distress.  HENT:  Head: Normocephalic and atraumatic.  Right Ear: External ear normal.  Left Ear: External ear normal.  Nose: Nose normal.  Mouth/Throat: Oropharynx is clear and moist. No oropharyngeal exudate.  Eyes: EOM are normal. Pupils are equal, round, and reactive to light. Right eye exhibits no discharge. Left eye exhibits no discharge. No scleral icterus.  Bilateral red eyes (chronic)  Neck: Normal range of motion. Neck supple.  Cardiovascular: Normal rate, regular rhythm and normal heart sounds.   Pulmonary/Chest: Effort normal and breath sounds normal. No respiratory distress. He has no wheezes. He has no rales.  Abdominal: Soft. Bowel sounds are normal. He exhibits no distension. There is no tenderness. There is no rebound and no guarding.  Musculoskeletal: Normal range of motion. He exhibits no edema or tenderness.  Neurological: He is alert and oriented to person, place, and time.  Skin: Skin is warm and dry. No rash noted. He is not diaphoretic. No erythema. No pallor.  Bilateral onychomycosis of toenails  Psychiatric: He has a normal mood and affect. His behavior is normal. Judgment and thought content normal.    Assessment & Plan:   Please see problem list for problem-based assessment and plan

## 2015-09-05 NOTE — Assessment & Plan Note (Signed)
Assessment: Pt with Stage 1 hypertension in setting of elevated blood pressures readings at last 3 visits not currently on anti-hypertensive therapy who presents with blood pressure of 147/84.   Plan:  -BP 147/84 not at goal <140/90 -Prescribe HCTZ 12.5 mg daily  -Obtain CMP at next visit

## 2015-09-06 LAB — MICROALBUMIN / CREATININE URINE RATIO
CREATININE, UR: 108 mg/dL
MICROALB/CREAT RATIO: 13.6 mg/g creat (ref 0.0–30.0)
MICROALBUM., U, RANDOM: 14.7 ug/mL

## 2015-09-24 NOTE — Progress Notes (Signed)
Internal Medicine Clinic Attending  Case discussed with Dr. Rabbani at the time of the visit.  We reviewed the resident's history and exam and pertinent patient test results.  I agree with the assessment, diagnosis, and plan of care documented in the resident's note.  

## 2015-09-29 ENCOUNTER — Telehealth: Payer: Self-pay | Admitting: Internal Medicine

## 2015-09-29 NOTE — Telephone Encounter (Signed)
APPT. REMINDER CALL, LMTCB °

## 2015-09-30 ENCOUNTER — Ambulatory Visit: Payer: Self-pay

## 2015-11-20 ENCOUNTER — Other Ambulatory Visit: Payer: Self-pay | Admitting: *Deleted

## 2015-11-20 DIAGNOSIS — E785 Hyperlipidemia, unspecified: Secondary | ICD-10-CM

## 2015-11-20 DIAGNOSIS — Z794 Long term (current) use of insulin: Secondary | ICD-10-CM

## 2015-11-20 DIAGNOSIS — E119 Type 2 diabetes mellitus without complications: Secondary | ICD-10-CM

## 2015-11-20 DIAGNOSIS — I10 Essential (primary) hypertension: Secondary | ICD-10-CM

## 2015-11-20 NOTE — Telephone Encounter (Signed)
PT NEEDS APPT

## 2015-11-20 NOTE — Telephone Encounter (Signed)
Which pharmacy - - the Rx were sent via fax last time.   Thanks

## 2015-11-25 ENCOUNTER — Encounter: Payer: Self-pay | Admitting: *Deleted

## 2015-12-17 ENCOUNTER — Encounter: Payer: Self-pay | Admitting: Internal Medicine

## 2016-02-04 ENCOUNTER — Telehealth: Payer: Self-pay | Admitting: Internal Medicine

## 2016-02-04 NOTE — Telephone Encounter (Signed)
APT. REMINDER CALL, LMTCB °

## 2016-02-05 ENCOUNTER — Ambulatory Visit: Payer: Self-pay

## 2016-02-11 ENCOUNTER — Ambulatory Visit (INDEPENDENT_AMBULATORY_CARE_PROVIDER_SITE_OTHER): Payer: Self-pay | Admitting: Pharmacist

## 2016-02-11 DIAGNOSIS — Z7189 Other specified counseling: Secondary | ICD-10-CM

## 2016-02-11 DIAGNOSIS — Z794 Long term (current) use of insulin: Secondary | ICD-10-CM

## 2016-02-11 DIAGNOSIS — Z79899 Other long term (current) drug therapy: Secondary | ICD-10-CM

## 2016-02-11 NOTE — Progress Notes (Signed)
S: Terry Castillo is a 50 y.o. male reports to clinical pharmacist for help with diabetes medication. Patient states he is out of insulin and it is on backorder at [facility] pharmacy.  No Known Allergies  Past Medical History:  Diagnosis Date  . Alcohol abuse   . Atrial fibrillation with RVR ([facility]) 2006   spontaneous conversion to sinus rhythm  . Diabetes mellitus    uncontrolled  . Onychomycosis   . Tobacco abuse    Social History   Social History  . Marital status: Legally Separated    Spouse name: N/A  . Number of children: N/A  . Years of education: 28   Occupational History  .     Social History Main Topics  . Smoking status: Current Every Day Smoker    Packs/day: 0.25    Types: Cigarettes  . Smokeless tobacco: Not on file     Comment: started back  . Alcohol use No     Comment: Quit in 2009  . Drug use: No  . Sexual activity: Not on file   Other Topics Concern  . Not on file   Social History Narrative   Financial assistance approved for 100% discount at [facility] and has [facility] card per Rudell Cobb October 3,2011 1:56PM      Dropped out of 11th grade in high school.   Used to work in [employer].   No family in [place] (2 sisters and a brother in [place]. [place])   Family History  Problem Relation Age of Onset  . Heart disease Mother 81    Myocardial infarction  . Diabetes Mother   . Hypertension Mother   . Cancer Father    O:    Component Value Date/Time   CHOL 202 (H) 11/01/2014 1450   HDL 60 11/01/2014 1450   TRIG 96 11/01/2014 1450   AST 14 06/28/2014 1457   ALT 13 06/28/2014 1457   NA 139 06/28/2014 1457   K 4.3 06/28/2014 1457   CL 106 06/28/2014 1457   CO2 24 06/28/2014 1457   GLUCOSE 78 06/28/2014 1457   HGBA1C 7.6 09/05/2015 1512   HGBA1C 8.0 03/31/2010 1505   BUN 10 06/28/2014 1457   CREATININE 0.94 06/28/2014 1457   GFRAA >89 06/28/2014 1457   TSH 0.570 06/28/2014 1457   Ht Readings from Last 2 Encounters:  05/30/15 6\' 1"  (1.854  m)  11/01/14 6\' 1"  (1.854 m)   Wt Readings from Last 2 Encounters:  09/05/15 189 lb (85.7 kg)  05/30/15 190 lb (86.2 kg)   There is no height or weight on file to calculate BMI. There were no vitals taken for this visit.   A/P: Thank you for including me in [employer] care.  Patient states he does monitor BG at home but does not know what his results are (did not bring meter today).   He does report polyuria but no other symptoms today. Medication Samples have been provided to the patient. The patient has been instructed regarding the correct time, dose, and frequency of taking this medication, including desired effects and most common side effects.    Drug: Insulin aspart 70/30 (Novolog Mix) Strength: 100 units/mL Qty: 1 vial LOT: [id number] Exp.Date: 10/18 Dosing instructions: 30 units BID  Patient no show x 3 since last office visit in April. Patient advised to schedule PCP follow up and front desk notified. Will discuss findings with PCP.  The patient verbalized understanding of information provided by repeating  back concepts discussed.   15 minutes spent face-to-face with the patient during the encounter. 50% of time spent on education. 50% of time was spent on assessment, plan, medication dispensing.

## 2016-02-19 ENCOUNTER — Telehealth: Payer: Self-pay | Admitting: Pharmacist

## 2016-02-23 ENCOUNTER — Telehealth: Payer: Self-pay | Admitting: Pharmacist

## 2016-02-23 NOTE — Progress Notes (Signed)
Patient called stating he needs help affording all medications and has run out of insulin. Appointment scheduled with me tomorrow 02/24/16

## 2016-02-24 ENCOUNTER — Encounter: Payer: Self-pay | Admitting: Pharmacist

## 2016-02-24 ENCOUNTER — Ambulatory Visit (INDEPENDENT_AMBULATORY_CARE_PROVIDER_SITE_OTHER): Payer: Self-pay | Admitting: Pharmacist

## 2016-02-24 DIAGNOSIS — E119 Type 2 diabetes mellitus without complications: Secondary | ICD-10-CM

## 2016-02-24 DIAGNOSIS — Z7901 Long term (current) use of anticoagulants: Secondary | ICD-10-CM

## 2016-02-24 DIAGNOSIS — I10 Essential (primary) hypertension: Secondary | ICD-10-CM

## 2016-02-24 DIAGNOSIS — Z7189 Other specified counseling: Secondary | ICD-10-CM

## 2016-02-24 DIAGNOSIS — Z9119 Patient's noncompliance with other medical treatment and regimen: Secondary | ICD-10-CM

## 2016-02-24 DIAGNOSIS — Z794 Long term (current) use of insulin: Secondary | ICD-10-CM

## 2016-02-24 DIAGNOSIS — Z79899 Other long term (current) drug therapy: Secondary | ICD-10-CM

## 2016-02-24 DIAGNOSIS — E785 Hyperlipidemia, unspecified: Secondary | ICD-10-CM

## 2016-02-24 DIAGNOSIS — Z596 Low income: Secondary | ICD-10-CM

## 2016-02-24 DIAGNOSIS — I480 Paroxysmal atrial fibrillation: Secondary | ICD-10-CM

## 2016-02-24 MED ORDER — ATORVASTATIN CALCIUM 40 MG PO TABS: 40.0000 mg | ORAL_TABLET | Freq: Every day | ORAL | 1 refills | Status: DC

## 2016-02-24 MED ORDER — RIVAROXABAN 20 MG PO TABS: 20.0000 mg | ORAL_TABLET | Freq: Every day | ORAL | 1 refills | Status: DC

## 2016-02-24 MED ORDER — METFORMIN HCL 1000 MG PO TABS: 1000.0000 mg | ORAL_TABLET | Freq: Two times a day (BID) | ORAL | 3 refills | Status: DC

## 2016-02-24 MED ORDER — HYDROCHLOROTHIAZIDE 12.5 MG PO TABS: 12.5000 mg | ORAL_TABLET | Freq: Every day | ORAL | 1 refills | Status: DC

## 2016-02-24 MED ORDER — INSULIN NPH ISOPHANE & REGULAR (70-30) 100 UNIT/ML ~~LOC~~ SUSP: 30.0000 [IU] | Freq: Two times a day (BID) | SUBCUTANEOUS | 3 refills | Status: DC

## 2016-02-24 NOTE — Patient Instructions (Signed)
Patient educated about medication as defined in this encounter and verbalized understanding by repeating back instructions provided.   

## 2016-02-24 NOTE — Progress Notes (Signed)
S: Terry Castillo is a 50 y.o. male reports to clinical pharmacist appointment for help with medications. Patient did not bring medication bottles, states he cannot afford medications  No Known Allergies Medication Sig  aspirin 81 MG chewable tablet Chew 81 mg by mouth daily.    Blood Glucose Monitoring Suppl (WAVESENSE AMP) W/DEVICE KIT Use to test blood glucose 3 times per day or as needed.  glucose blood (WAVESENSE PRESTO) test strip Use as instructed  hydrochlorothiazide (HYDRODIURIL) 12.5 MG tablet Take 1 tablet (12.5 mg total) by mouth daily.  insulin NPH-regular Human (HUMULIN 70/30) (70-30) 100 UNIT/ML injection Inject 30 units SQ in the morning and 30 units SQ in the evening  Insulin Syringe-Needle U-100 (SM INSULIN SYRINGE) 31G X 5/16" 0.5 ML MISC Inject 1 Syringe into the skin 2 (two) times daily. Use with Novolog 7030 as instructed 2(twice) daiy  Lancet Devices MISC Use to check blood sugar 3 times a day   metFORMIN (GLUCOPHAGE) 1000 MG tablet Take 1 tablet (1,000 mg total) by mouth 2 (two) times daily with a meal.  pravastatin (PRAVACHOL) 40 MG tablet Take 1 tablet (40 mg total) by mouth daily.   Past Medical History:  Diagnosis Date  . Alcohol abuse   . Atrial fibrillation with RVR ([place]) 2006   spontaneous conversion to sinus rhythm  . Diabetes mellitus    uncontrolled  . Onychomycosis   . Tobacco abuse    Social History   Social History  . Marital status: Legally Separated    Spouse name: N/A  . Number of children: N/A  . Years of education: 66   Occupational History  .     Social History Main Topics  . Smoking status: Current Every Day Smoker    Packs/day: 0.25    Types: Cigarettes  . Smokeless tobacco: Not on file     Comment: started back  . Alcohol use No     Comment: Quit in 2009  . Drug use: No  . Sexual activity: Not on file   Other Topics Concern  . Not on file   Social History Narrative   Financial assistance approved for 100% discount at [facility]  and has [facility] card per Bonna Gains October 3,2011 1:56PM      Dropped out of 11th grade in high school.   Used to work in [employer].   No family in [place] (2 sisters and a brother in [place])   Family History  Problem Relation Age of Onset  . Heart disease Mother 25    Myocardial infarction  . Diabetes Mother   . Hypertension Mother   . Cancer Father     O:    Component Value Date/Time   CHOL 202 (H) 11/01/2014 1450   HDL 60 11/01/2014 1450   TRIG 96 11/01/2014 1450   AST 14 06/28/2014 1457   ALT 13 06/28/2014 1457   NA 139 06/28/2014 1457   K 4.3 06/28/2014 1457   CL 106 06/28/2014 1457   CO2 24 06/28/2014 1457   GLUCOSE 78 06/28/2014 1457   HGBA1C 7.6 09/05/2015 1512   HGBA1C 8.0 03/31/2010 1505   BUN 10 06/28/2014 1457   CREATININE 0.94 06/28/2014 1457   CALCIUM 9.0 06/28/2014 1457   GFRAA >89 06/28/2014 1457   WBC 6.1 06/28/2014 1457   HGB 14.3 06/28/2014 1457   HCT 41.8 06/28/2014 1457   PLT 224 06/28/2014 1457   TSH 0.570 06/28/2014 1457   Ht Readings from Last  2 Encounters:  05/30/15 '6\' 1"'$  (1.854 m)  11/01/14 '6\' 1"'$  (1.854 m)   Wt Readings from Last 2 Encounters:  09/05/15 189 lb (85.7 kg)  05/30/15 190 lb (86.2 kg)   There is no height or weight on file to calculate BMI. BP Readings from Last 3 Encounters:  09/05/15 (!) 147/84  05/30/15 151/84  05/27/15 154/88     A/P:  BP and A1C are close to goal, patient states he cannot afford/not currently taking his medications. Patient referred to [facility]. Medication Samples have been provided to the patient.  Drug: insulin aspart 70/30 (Novolog mix) Strength: 100 units/mL Qty: 1 LOT: [id number] Exp.Date: 02/2017  Dosing instructions: 30 units BID History of paroxysmal atrial fibrillation with RVR, CHA2DS2VASC score of 2, add rivaroxaban 20 mg daily, discussed and approved by PCP. D/C aspirin Medication Samples have been provided to the patient.  Drug: rivaroxaban  (Xarelto) Strength: 20 mg Qty: 7 LOT: '16MG'$ 053 Exp.Date: 9/19  Dosing instructions: 1 tablet daily with a meal Estimated 10-year CV risk 24.6%, Increase statin intensity (also approved by PCP). Interchange pravastatin to atorvastatin per [place] [facility] formulary. The patient has been instructed regarding his medications.   The patient verbalized understanding of information provided by repeating back concepts discussed.   30 minutes spent face-to-face with the patient during the encounter. 50% of time spent on education. 50% of time was spent on assessment, plan, and coordination of care.

## 2016-03-08 ENCOUNTER — Ambulatory Visit: Payer: Self-pay | Admitting: Pharmacist

## 2016-03-08 DIAGNOSIS — Z79899 Other long term (current) drug therapy: Secondary | ICD-10-CM

## 2016-03-08 NOTE — Progress Notes (Signed)
I have reviewed Dr. Elmyra RicksKim's documentation.

## 2016-03-08 NOTE — Progress Notes (Signed)
S: Terry Castillo is a 50 y.o. male who reports to clinical pharmacist appointment for medication management.   No Known Allergies Medication Sig  atorvastatin (LIPITOR) 40 MG tablet Take 1 tablet (40 mg total) by mouth daily.  Blood Glucose Monitoring Suppl (WAVESENSE AMP) W/DEVICE KIT Use to test blood glucose 3 times per day or as needed.  glucose blood (WAVESENSE PRESTO) test strip Use as instructed  hydrochlorothiazide (HYDRODIURIL) 12.5 MG tablet Take 1 tablet (12.5 mg total) by mouth daily.  insulin NPH-regular Human (HUMULIN 70/30) (70-30) 100 UNIT/ML injection Inject 30 Units into the skin 2 (two) times daily with a meal.  Insulin Syringe-Needle U-100 (SM INSULIN SYRINGE) 31G X 5/16" 0.5 ML MISC Inject 1 Syringe into the skin 2 (two) times daily. Use with Novolog 7030 as instructed 2(twice) daiy  Lancet Devices MISC Use to check blood sugar 3 times a day   metFORMIN (GLUCOPHAGE) 1000 MG tablet Take 1 tablet (1,000 mg total) by mouth 2 (two) times daily with a meal.  rivaroxaban (XARELTO) 20 MG TABS tablet Take 1 tablet (20 mg total) by mouth daily with supper.   Past Medical History:  Diagnosis Date  . Alcohol abuse   . Atrial fibrillation with RVR ([place]) 2006   spontaneous conversion to sinus rhythm  . Diabetes mellitus    uncontrolled  . Onychomycosis   . Tobacco abuse    Social History   Social History  . Marital status: Legally Separated    Spouse name: N/A  . Number of children: N/A  . Years of education: 39   Occupational History  .     Social History Main Topics  . Smoking status: Current Every Day Smoker    Packs/day: 0.25    Types: Cigarettes  . Smokeless tobacco: Not on file     Comment: started back  . Alcohol use No     Comment: Quit in 2009  . Drug use: No  . Sexual activity: Not on file   Other Topics Concern  . Not on file   Social History Narrative   Financial assistance approved for 100% discount at [facility] and has [facility] card per Bonna Gains  October 3,2011 1:56PM      Dropped out of 11th grade in high school.   Used to work in [employer].   No family in [place] (2 sisters and a brother in [place])   Family History  Problem Relation Age of Onset  . Heart disease Mother 12    Myocardial infarction  . Diabetes Mother   . Hypertension Mother   . Cancer Father     O:    Component Value Date/Time   CHOL 202 (H) 11/01/2014 1450   HDL 60 11/01/2014 1450   TRIG 96 11/01/2014 1450   AST 14 06/28/2014 1457   ALT 13 06/28/2014 1457   NA 139 06/28/2014 1457   K 4.3 06/28/2014 1457   CL 106 06/28/2014 1457   CO2 24 06/28/2014 1457   GLUCOSE 78 06/28/2014 1457   HGBA1C 7.6 09/05/2015 1512   HGBA1C 8.0 03/31/2010 1505   BUN 10 06/28/2014 1457   CREATININE 0.94 06/28/2014 1457   CALCIUM 9.0 06/28/2014 1457   GFRAA >89 06/28/2014 1457   WBC 6.1 06/28/2014 1457   HGB 14.3 06/28/2014 1457   HCT 41.8 06/28/2014 1457   PLT 224 06/28/2014 1457   TSH 0.570 06/28/2014 1457   Ht Readings from Last 2 Encounters:  05/30/15 '6\' 1"'$  (1.854  m)  11/01/14 '6\' 1"'$  (1.854 m)   Wt Readings from Last 2 Encounters:  09/05/15 189 lb (85.7 kg)  05/30/15 190 lb (86.2 kg)   There is no height or weight on file to calculate BMI. BP Readings from Last 3 Encounters:  09/05/15 (!) 147/84  05/30/15 151/84  05/27/15 154/88   A/P: Patient referred to [place] ($0 income currently)  Medication Samples have been provided to the patient.  Drug: Insulin aspart 70/30 (Novolog) Strength: 100 units/mL Qty: 1 LOT: [id number] Exp.Date: 10/18 Dosing instructions: 30 units BID  The patient has been instructed regarding the correct time, dose, and frequency of taking this medication, including desired effects and most common side effects.   Sharron Simpson J 1:51 PM 03/08/2016  Patient advised to follow up if further assistance needed (verbalized understanding).

## 2016-03-12 NOTE — Telephone Encounter (Signed)
Contacted patient for help with medications, unable to reach

## 2016-03-23 ENCOUNTER — Ambulatory Visit: Payer: Self-pay | Admitting: Pharmacist

## 2016-03-23 DIAGNOSIS — Z79899 Other long term (current) drug therapy: Secondary | ICD-10-CM

## 2016-03-23 NOTE — Progress Notes (Signed)
Medication Samples have been provided to the patient.  Drug: insulin aspart mix 70/30 (Novolog mix) Strength: 100 units/mL Qty: 1 LOT: [id number] Exp.Date: 10/17 Dosing instructions: 30 units BID  The patient has been instructed regarding the correct time, dose, and frequency of taking this medication, including desired effects and most common side effects.   Patient had a delay in processing [place] [facility] application. Referred patient to contact the pharmacy to discuss further paperwork needed. Patient verbalized understanding.  Shivonne Schwartzman J 4:30 PM 03/23/2016

## 2016-03-31 ENCOUNTER — Ambulatory Visit: Payer: Self-pay | Admitting: Pharmacist

## 2016-03-31 DIAGNOSIS — Z79899 Other long term (current) drug therapy: Secondary | ICD-10-CM

## 2016-04-01 ENCOUNTER — Ambulatory Visit: Payer: Self-pay

## 2016-04-01 NOTE — Progress Notes (Signed)
Medication Samples have been provided to the patient.  Drug: Novolog 70/30 Flex Pen Strength: 100-U/ml Qty: 1  LOT: [id number]  Exp.Date: 07/2016 Dosing instructions: Inject 30 units SubQ BID   The patient has been instructed regarding the correct time, dose, and frequency of taking this medication, including desired effects and most common side effects.   Samples signed for by Dr. Marzetta BoardJennifer Kim, PharmD  Terry CourierFrank  Terry Castillo 3:06 PM 04/01/2016

## 2016-04-01 NOTE — Progress Notes (Signed)
Medication Samples have been provided to the patient.  Drug: rivaroxaban (Xarelto) Strength: 20 mg Qty: 14  LOT: 16MG 053 Exp.Date: 9/19 Dosing instructions: 1 tablet daily  The patient has been instructed regarding the correct time, dose, and frequency of taking this medication, including desired effects and most common side effects.   Kim,Jennifer J 8:52 AM 04/01/2016

## 2016-04-07 ENCOUNTER — Encounter: Payer: Self-pay | Admitting: Internal Medicine

## 2016-04-07 NOTE — Progress Notes (Deleted)
   CC: ***  HPI:  [name] is a 50 y.o. with a PMH of ***  Please see problem based Assessment and Plan for status of patients chronic conditions.  Past Medical History:  Diagnosis Date  . Alcohol abuse   . Atrial fibrillation with RVR ([facility]) 2006   spontaneous conversion to sinus rhythm  . Diabetes mellitus    uncontrolled  . Onychomycosis   . Tobacco abuse     Review of Systems:   ROS  Physical Exam:  There were no vitals filed for this visit. Physical Exam  Assessment & Plan:   See Encounters Tab for problem based charting.   Patient {GC/GE:3044014::"discussed with","seen with"} Dr. {NAMES:[id number]::"Butcher","Granfortuna","E. Hoffman","Klima","Mullen","Narendra","Vincent"}   Terry MarketGorica Axiel Fjeld, MD Internal Medicine PGY1

## 2016-04-12 ENCOUNTER — Telehealth: Payer: Self-pay | Admitting: Internal Medicine

## 2016-04-12 NOTE — Telephone Encounter (Signed)
APT. REMINDER CALL, LMTCB °

## 2016-04-13 ENCOUNTER — Ambulatory Visit: Payer: Self-pay

## 2016-04-13 ENCOUNTER — Other Ambulatory Visit: Payer: Self-pay | Admitting: Pharmacist

## 2016-04-19 ENCOUNTER — Telehealth: Payer: Self-pay | Admitting: Internal Medicine

## 2016-04-19 NOTE — Telephone Encounter (Signed)
APT. REMINDER CALL, LMTCB °

## 2016-04-20 ENCOUNTER — Ambulatory Visit: Payer: Self-pay

## 2016-04-28 ENCOUNTER — Telehealth: Payer: Self-pay

## 2016-04-28 ENCOUNTER — Ambulatory Visit: Payer: Self-pay | Admitting: Pharmacist

## 2016-05-11 NOTE — Progress Notes (Signed)
Called patient for medication review---unable to reach

## 2016-06-08 ENCOUNTER — Other Ambulatory Visit: Payer: Self-pay | Admitting: *Deleted

## 2016-06-08 DIAGNOSIS — E119 Type 2 diabetes mellitus without complications: Secondary | ICD-10-CM

## 2016-06-09 MED ORDER — INSULIN NPH ISOPHANE & REGULAR (70-30) 100 UNIT/ML ~~LOC~~ SUSP: 30.0000 [IU] | Freq: Two times a day (BID) | SUBCUTANEOUS | 2 refills | Status: DC

## 2016-07-30 ENCOUNTER — Telehealth: Payer: Self-pay | Admitting: Internal Medicine

## 2016-10-28 ENCOUNTER — Ambulatory Visit: Payer: Self-pay | Admitting: Pharmacist

## 2016-10-28 ENCOUNTER — Other Ambulatory Visit: Payer: Self-pay | Admitting: Pharmacist

## 2016-10-28 DIAGNOSIS — Z79899 Other long term (current) drug therapy: Secondary | ICD-10-CM

## 2016-10-28 DIAGNOSIS — E119 Type 2 diabetes mellitus without complications: Secondary | ICD-10-CM

## 2016-10-28 MED ORDER — INSULIN NPH ISOPHANE & REGULAR (70-30) 100 UNIT/ML ~~LOC~~ SUSP: 30.0000 [IU] | Freq: Two times a day (BID) | SUBCUTANEOUS | 11 refills | Status: DC

## 2016-10-28 NOTE — Progress Notes (Signed)
Medications were reviewed with the patient, including name, instructions, indication, goals of therapy, potential side effects, importance of adherence, and safe use.  Patient verbalized understanding by repeating back information and was advised to contact me if further medication-related questions arise. [facility] for [facility] medication access.

## 2016-11-30 ENCOUNTER — Other Ambulatory Visit: Payer: Self-pay | Admitting: Pharmacist

## 2016-11-30 DIAGNOSIS — I480 Paroxysmal atrial fibrillation: Secondary | ICD-10-CM

## 2016-12-02 MED ORDER — RIVAROXABAN 20 MG PO TABS: 20.0000 mg | ORAL_TABLET | Freq: Every day | ORAL | 1 refills | Status: DC

## 2016-12-03 ENCOUNTER — Other Ambulatory Visit: Payer: Self-pay | Admitting: Internal Medicine

## 2016-12-03 ENCOUNTER — Encounter: Payer: Self-pay | Admitting: Internal Medicine

## 2016-12-03 DIAGNOSIS — I10 Essential (primary) hypertension: Secondary | ICD-10-CM

## 2016-12-03 DIAGNOSIS — E119 Type 2 diabetes mellitus without complications: Secondary | ICD-10-CM

## 2016-12-03 DIAGNOSIS — Z794 Long term (current) use of insulin: Secondary | ICD-10-CM

## 2016-12-03 DIAGNOSIS — E785 Hyperlipidemia, unspecified: Secondary | ICD-10-CM

## 2017-01-05 ENCOUNTER — Encounter: Payer: Self-pay | Admitting: Internal Medicine

## 2017-01-18 ENCOUNTER — Encounter: Payer: Self-pay | Admitting: Internal Medicine

## 2017-01-18 NOTE — Progress Notes (Signed)
CC: diabetes  HPI:  [name] is a 51 y.o. with a PMH of T2DM, HTN, HLD, paroxysmal a fib, h/o alcohol abuse presenting to clinic for follow up for diabetes.  T2DM: Patient states he is compliant with humulin 70/30 mix at 30 units BID with meals; he has stopped taking metformin a few months ago. He states he checks his CBG about twice a day before administering the insulin and only states a value of 190. He denies hypo or hyperglycemic symptoms; denies hypoglycemic episodes of which is he aware. On arrival to the clinic, patient's CBG is 43 - patient endorses being asymptomatic; he states he has not eaten since early morning.   HTN:  Patient states he has stopped taking HCTZ for hypertension for several months; he states his wife checks his BP at home and that she has told him it was good - he does not know what his home values are. He denies chest pain, shortness of breath, headaches, vision or hearing changes.   Paroxysmal a fib: Patient denies further episodes of a fib since one and only occurrence which was precipitated by alcohol intoxication and resolved spontaneously. He endorses compliance with Xarelto and denies bleeding episodes; he denies shortness of breath, chest pain, calf swelling/tenderness.  EtOH abuse: Patient states he now only drinks about 2 12oz cans of beer per week and has not drank heavily since 2009.   Please see problem based Assessment and Plan for status of patients chronic conditions.  Past Medical History:  Diagnosis Date  . Alcohol abuse   . Atrial fibrillation with RVR ([facility]) 2006   spontaneous conversion to sinus rhythm  . Diabetes mellitus    uncontrolled  . Onychomycosis   . Tobacco abuse     Review of Systems:   Review of Systems  Constitutional: Negative for chills, diaphoresis, fever, malaise/fatigue and weight loss.  HENT: Negative for hearing loss.   Eyes: Negative for blurred vision and double vision.  Respiratory: Negative for  shortness of breath.   Cardiovascular: Negative for chest pain, palpitations and leg swelling.  Gastrointestinal: Negative for abdominal pain, blood in stool, constipation, diarrhea, melena, nausea and vomiting.  Genitourinary: Negative for frequency.  Musculoskeletal: Negative for myalgias.  Neurological: Negative for dizziness, sensory change, focal weakness, weakness and headaches.  Endo/Heme/Allergies: Negative for polydipsia.  Psychiatric/Behavioral: Negative for substance abuse.    Physical Exam:  Vitals:   01/19/17 1519  BP: 140/81  Pulse: 87  Temp: 98.1 F (36.7 C)  TempSrc: Oral  SpO2: 100%  Weight: 200 lb 3.2 oz (90.8 kg)  Height: 6\' 1"  (1.854 m)   Physical Exam  Constitutional: He is oriented to person, place, and time. He appears well-developed and well-nourished. No distress.  HENT:  Head: Normocephalic and atraumatic.  Eyes: EOM are normal.  Injected conjunctiva  Neck: Normal range of motion.  Cardiovascular: Normal heart sounds and intact distal pulses.  Exam reveals no gallop and no friction rub.   No murmur heard. Pulmonary/Chest: Effort normal and breath sounds normal. He has no wheezes. He has no rales.  Abdominal: Soft. Bowel sounds are normal. He exhibits no distension and no mass. There is no tenderness.  Musculoskeletal: Normal range of motion. He exhibits no edema or tenderness.  Neurological: He is alert and oriented to person, place, and time. He exhibits normal muscle tone.  Skin: Skin is warm and dry. He is not diaphoretic.  Psychiatric: He has a normal mood and affect. His behavior is normal.  Judgment and thought content normal.    Assessment & Plan:   See Encounters Tab for problem based charting.   Patient discussed with Dr. Cleda Daub   Nyra Market, MD Internal Medicine PGY2

## 2017-01-19 ENCOUNTER — Ambulatory Visit (INDEPENDENT_AMBULATORY_CARE_PROVIDER_SITE_OTHER): Payer: Self-pay | Admitting: Internal Medicine

## 2017-01-19 ENCOUNTER — Encounter: Payer: Self-pay | Admitting: Internal Medicine

## 2017-01-19 VITALS — BP 140/81 | HR 87 | Temp 98.1°F | Ht 73.0 in | Wt 200.2 lb

## 2017-01-19 DIAGNOSIS — I480 Paroxysmal atrial fibrillation: Secondary | ICD-10-CM

## 2017-01-19 DIAGNOSIS — F17210 Nicotine dependence, cigarettes, uncomplicated: Secondary | ICD-10-CM

## 2017-01-19 DIAGNOSIS — E785 Hyperlipidemia, unspecified: Secondary | ICD-10-CM

## 2017-01-19 DIAGNOSIS — F1011 Alcohol abuse, in remission: Secondary | ICD-10-CM

## 2017-01-19 DIAGNOSIS — Z794 Long term (current) use of insulin: Secondary | ICD-10-CM

## 2017-01-19 DIAGNOSIS — E11649 Type 2 diabetes mellitus with hypoglycemia without coma: Secondary | ICD-10-CM

## 2017-01-19 DIAGNOSIS — E782 Mixed hyperlipidemia: Secondary | ICD-10-CM

## 2017-01-19 DIAGNOSIS — Z560 Unemployment, unspecified: Secondary | ICD-10-CM

## 2017-01-19 DIAGNOSIS — Z7901 Long term (current) use of anticoagulants: Secondary | ICD-10-CM

## 2017-01-19 DIAGNOSIS — Z0000 Encounter for general adult medical examination without abnormal findings: Secondary | ICD-10-CM

## 2017-01-19 DIAGNOSIS — Z79899 Other long term (current) drug therapy: Secondary | ICD-10-CM

## 2017-01-19 DIAGNOSIS — Z720 Tobacco use: Secondary | ICD-10-CM

## 2017-01-19 DIAGNOSIS — E119 Type 2 diabetes mellitus without complications: Secondary | ICD-10-CM

## 2017-01-19 DIAGNOSIS — I10 Essential (primary) hypertension: Secondary | ICD-10-CM

## 2017-01-19 LAB — GLUCOSE, CAPILLARY: GLUCOSE-CAPILLARY: 67 mg/dL (ref 65–99)

## 2017-01-19 LAB — POCT GLYCOSYLATED HEMOGLOBIN (HGB A1C): Hemoglobin A1C: 12.6

## 2017-01-19 NOTE — Patient Instructions (Addendum)
For your diabetes, please check your sugars first thing in the morning before eating, and a couple of times per day. Please bring in your meter to your next appointment so we can further manage your diabetes. Please start taking metformin again - follow the schedule I provided you.   Please check you BP and bring in the log to your next appointment.  I will see you in about 4 weeks

## 2017-01-20 ENCOUNTER — Encounter: Payer: Self-pay | Admitting: Internal Medicine

## 2017-01-20 NOTE — Assessment & Plan Note (Signed)
Patient states he now only drinks about 2 12oz cans of beer per week and has not drank heavily since 2009.

## 2017-01-20 NOTE — Assessment & Plan Note (Signed)
Patient denies further episodes of a fib since one and only occurrence which was precipitated by alcohol intoxication and resolved spontaneously. He endorses compliance with Xarelto and denies bleeding episodes; he denies shortness of breath, chest pain, calf swelling/tenderness.  CHA2DS2-VASc Score is 2 indicating reasonable use of Xarelto.   Plan: --continue xarelto 20mg  daily

## 2017-01-20 NOTE — Assessment & Plan Note (Addendum)
Patient currently uninsured - he has not yet met with our financial counselor for orange card application. He states that he cannot currently afford HM measures that are indicated: --HCV screen --diabetic retinopathy screening - currently denies vision problems --colonoscopy - denies abd pain, hematochezia, melena, weight loss

## 2017-01-20 NOTE — Assessment & Plan Note (Addendum)
Patient recently started smoking again. Discussed cessation which he will think about.

## 2017-01-20 NOTE — Assessment & Plan Note (Addendum)
Patient states he is compliant with humulin 70/30 mix at 30 units BID with meals; he has stopped taking metformin a few months ago. He states he checks his CBG about twice a day before administering the insulin and only states a value of 190. He denies hypo or hyperglycemic symptoms; denies hypoglycemic episodes of which is he aware. On arrival to the clinic, patient's CBG is 34 - patient endorses being asymptomatic; he states he has not eaten since early morning.  Patient was treated with juice for hypoglycemia, however left the appointment before a repeat CBG could be checked.   Plan: --A1c today is 12.6 - uncontrolled --restart metformin, titrate from 500mg  daily to 1000mg  BID with meals over next couple of weeks --continue humulin 70/30 30 units BID with meals --asked patient to check his CBGs several times a day and bring in his log to f/u appointment to further titrate insulin or add another oral medicine --f/u in 4 weeks

## 2017-01-20 NOTE — Assessment & Plan Note (Signed)
Patient states compliance with atorvastatin 40mg  daily and denies myalgias.  Plan: --continue atorvastatin 40mg  daily

## 2017-01-20 NOTE — Assessment & Plan Note (Addendum)
Patient states he has stopped taking HCTZ for hypertension for several months; he states his wife checks his BP at home and that she has told him it was good - he does not know what his home values are. He denies chest pain, shortness of breath, headaches, vision or hearing changes.  BP today 140/81; he recently started smoking tobacco again which could be contributing to elevated BP  Plan: --patient in agreement to bring in BP log from home to next visit and will d/c HCTZ from his meds list; if persistently elevated at home or on f/u visit, will discuss restarting therapy --advised tobacco cessation as he may be able to avoid BP meds  --f/u in 4 wks

## 2017-01-23 NOTE — Progress Notes (Signed)
Internal Medicine Clinic Attending  Case discussed with Dr. Svalina  at the time of the visit.  We reviewed the resident's history and exam and pertinent patient test results.  I agree with the assessment, diagnosis, and plan of care documented in the resident's note.  

## 2017-03-22 NOTE — Progress Notes (Deleted)
   CC: ***  HPI:  [name] is a 51 y.o. with a PMH of ***  T2DM: A1c 01/19/17 was 12.6; patient on Humulin 70/30 at 30 units BID and at last visit was asked to start and titrate up metformin to 1000mg  BID.   HTN: At last visit, patient endorsed stopping HCTZ for his HTN. He was asked to bring home BP log before deciding on restarting therapy.  Please see problem based Assessment and Plan for status of patients chronic conditions.  Past Medical History:  Diagnosis Date  . Alcohol abuse   . Atrial fibrillation with RVR ([facility]) 2006   spontaneous conversion to sinus rhythm  . Diabetes mellitus    uncontrolled  . Onychomycosis   . Tobacco abuse     Review of Systems:   ROS  Physical Exam:  There were no vitals filed for this visit. GENERAL- alert, co-operative, appears as stated age, not in any distress. HEENT- Atraumatic, normocephalic, PERRL, EOMI, oral mucosa appears moist CARDIAC- RRR, no murmurs, rubs or gallops. RESP- Moving equal volumes of air, and clear to auscultation bilaterally, no wheezes or crackles. ABDOMEN- Soft, nontender, bowel sounds present. NEURO- No obvious Cr N abnormality. EXTREMITIES- pulse 2+, symmetric, no pedal edema. SKIN- Warm, dry, No rash or lesion. PSYCH- Normal mood and affect, appropriate thought content and speech.  Assessment & Plan:   See Encounters Tab for problem based charting.   Patient {GC/GE:3044014::"discussed with","seen with"} Dr. {NAMES:[id number]::"Butcher","Granfortuna","E. Hoffman","Klima","Mullen","Narendra","Vincent"}   Terry MarketGorica Friedrich Harriott, MD Internal Medicine PGY2

## 2017-03-23 ENCOUNTER — Encounter: Payer: Self-pay | Admitting: Internal Medicine

## 2017-06-09 ENCOUNTER — Ambulatory Visit (INDEPENDENT_AMBULATORY_CARE_PROVIDER_SITE_OTHER): Payer: Self-pay | Admitting: Pharmacist

## 2017-06-09 VITALS — BP 148/86 | HR 87

## 2017-06-09 DIAGNOSIS — E119 Type 2 diabetes mellitus without complications: Secondary | ICD-10-CM

## 2017-06-09 DIAGNOSIS — Z794 Long term (current) use of insulin: Secondary | ICD-10-CM

## 2017-06-09 LAB — GLUCOSE, CAPILLARY: GLUCOSE-CAPILLARY: 229 mg/dL — AB (ref 65–99)

## 2017-06-09 LAB — POCT GLYCOSYLATED HEMOGLOBIN (HGB A1C): Hemoglobin A1C: 10.2

## 2017-06-09 MED ORDER — METFORMIN HCL 1000 MG PO TABS: 1000.0000 mg | ORAL_TABLET | Freq: Two times a day (BID) | ORAL | 3 refills | Status: DC

## 2017-06-09 NOTE — Progress Notes (Signed)
Patient was seen in clinic with Terry Castillo SaaMitchell Haynes, PharmD candidate. I agree with the assessment and plan of care documented. Patient was advised to follow up with PCP, appointment scheduled for 06/16/16.

## 2017-06-09 NOTE — Progress Notes (Signed)
S:    Terry Castillo is a 52 y.o. male reports to clinical pharmacist appointment for follow-up hypertension and diabetes medication management.  Self-monitoring BP: Yes, readings: they're alright, a little high.  Diet/lifestyle: caffeine: Yes, 1.5 cups this morning, Tobacco: Hasn't smoked today.  Brief Symptom Review Chest (or any other) pain? NO Difficulty breathing? NO Lightheadedness/dizziness? NO Headaches? NO Vision changes? NO Swelling? NO Problems with swallowing or digestion? NO Problems with bowels/urination? NO Fever? NO  O:  BP Readings from Last 3 Encounters:  06/09/17 (!) 148/86  01/19/17 140/81  09/05/15 (!) 147/84   Todays (06/09/17) Blood glucose was 229 and A1c was 10.6.   Pertinent labs:    Component Value Date/Time   NA 139 06/28/2014 1457   K 4.3 06/28/2014 1457   CL 106 06/28/2014 1457   CO2 24 06/28/2014 1457   GLUCOSE 78 06/28/2014 1457   BUN 10 06/28/2014 1457   CREATININE 0.94 06/28/2014 1457   CALCIUM 9.0 06/28/2014 1457   GFRNONAA >89 06/28/2014 1457   GFRAA >89 06/28/2014 1457   A/P: History of hypertension which currently is not controlled on current medications.  Changes/recommendations: Consider restarting Hydrochlorothiazide after obtaining relevant labs.   Per Dr. Kandice HamsSvalina's note on 01/19/17 patient was instructed to restart Metformin. Prescription was expired and refill was sent in. Metformin adherence will need to be clarified at follow-up PCP visit. Patient emphasized he only needed help with his insulin today, and he was given a sample of a 10 mL vial.   If additional Diabetes therapy is desired [place] [facility] formulary contains both Sitagliptin/metformin (Janumet), and Canagliflozin (Invokana).   Follow-up Patient has an appointment on 06/16/17 with Dr. Samuella CotaSvalina.   Terry Castillo, PharmD Candidate

## 2017-06-16 ENCOUNTER — Ambulatory Visit (INDEPENDENT_AMBULATORY_CARE_PROVIDER_SITE_OTHER): Payer: PRIVATE HEALTH INSURANCE | Admitting: Internal Medicine

## 2017-06-16 ENCOUNTER — Other Ambulatory Visit: Payer: Self-pay

## 2017-06-16 VITALS — BP 135/74 | HR 86 | Temp 98.0°F | Ht 73.0 in | Wt 201.9 lb

## 2017-06-16 DIAGNOSIS — F17211 Nicotine dependence, cigarettes, in remission: Secondary | ICD-10-CM | POA: Diagnosis not present

## 2017-06-16 DIAGNOSIS — E118 Type 2 diabetes mellitus with unspecified complications: Secondary | ICD-10-CM

## 2017-06-16 DIAGNOSIS — I10 Essential (primary) hypertension: Secondary | ICD-10-CM

## 2017-06-16 DIAGNOSIS — I480 Paroxysmal atrial fibrillation: Secondary | ICD-10-CM | POA: Diagnosis not present

## 2017-06-16 DIAGNOSIS — Z794 Long term (current) use of insulin: Secondary | ICD-10-CM | POA: Diagnosis not present

## 2017-06-16 DIAGNOSIS — Z7901 Long term (current) use of anticoagulants: Secondary | ICD-10-CM

## 2017-06-16 DIAGNOSIS — Z720 Tobacco use: Secondary | ICD-10-CM

## 2017-06-16 DIAGNOSIS — E119 Type 2 diabetes mellitus without complications: Secondary | ICD-10-CM

## 2017-06-16 LAB — GLUCOSE, CAPILLARY: GLUCOSE-CAPILLARY: 257 mg/dL — AB (ref 65–99)

## 2017-06-16 NOTE — Patient Instructions (Signed)
FOLLOW-UP INSTRUCTIONS When: 3 months For: diabetes What to bring: glucose meter  Keep taking your insulin like you have been Check your sugars 2-3 times a day and bring in your glucose meter to your next visit Start taking metformin:  500mg  once a day with a meal for 3 days then increase to twice a day with  meals; after a week increase further to 1000mg  (2 tabs) twice a day with  meals.   Great job on stopping smoking! Keep checking you blood pressure at home and bring in your home Blood Pressure log to your next visit.

## 2017-06-16 NOTE — Progress Notes (Signed)
   CC: Diabetes  HPI:  [name] is a 52 y.o. with a PMH of T2DM, paroxysmal A fiv, tobacco use disorder presenting to clinic for follow up on diabetes.  DM: Patient with uncontrolled T2DM with last A1c 06/09/17 of 10.6. He is on Humulin 70/30 mix 30 units BID and was supposed to be on metformin 1000mg  BID, however he has not started this yet. He denies polyuria, polydipsia, N/V, blurry vision.   HTN: Patient with diagnosis of hypertension, however he has stopped taking HCTZ. At last visit, we discussed keeping a home BP log; patient states his wife checks his BP regularly but they have not written it down and he does not know the readings. He has quit smoking since last visit. He denies chest pain, shortness of breath, LE swelling, headaches, vision or hearing changes.  Further patient denies fevers, palpitations, hematuria, easy bruising, melena or hematochezia.  Please see problem based Assessment and Plan for status of patients chronic conditions.  Past Medical History:  Diagnosis Date  . Alcohol abuse   . Atrial fibrillation with RVR (HCC) 2006   spontaneous conversion to sinus rhythm  . Diabetes mellitus    uncontrolled  . Onychomycosis   . Tobacco abuse     Review of Systems:   ROS Per HPI  Physical Exam:  Vitals:   06/16/17 1002 06/16/17 1101  BP: (!) 153/76 135/74  Pulse: 88 86  Temp: 98 F (36.7 C)   TempSrc: Oral   SpO2: 98%   Weight: 201 lb 14.4 oz (91.6 kg)   Height: 6\' 1"  (1.854 m)    GENERAL- alert, co-operative, appears as stated age, not in any distress. HEENT-  oral mucosa appears moist CARDIAC- RRR, no murmurs, rubs or gallops. RESP- Moving equal volumes of air, and clear to auscultation bilaterally, no wheezes or crackles. ABDOMEN- Soft, nontender, bowel sounds present. NEURO- No obvious Cr N abnormality. EXTREMITIES- pulse 2+ PT, symmetric, no pedal edema. SKIN- Warm, dry.Marland Kitchen. PSYCH- Normal mood and affect, appropriate thought content and  speech.  Assessment & Plan:   See Encounters Tab for problem based charting.   Patient discussed with Dr. Cecilie KicksKlima   Terry Levene, MD Internal Medicine PGY2

## 2017-06-17 ENCOUNTER — Encounter: Payer: Self-pay | Admitting: Internal Medicine

## 2017-06-17 NOTE — Assessment & Plan Note (Signed)
Patient with uncontrolled T2DM with last A1c 06/09/17 of 10.6. He is on Humulin 70/30 mix 30 units BID and was supposed to be on metformin 1000mg  BID, however he has not started this yet. He denies polyuria, polydipsia, N/V, blurry vision. He denies hypoglycemic symptoms or CBG readings. States CBG is usually <200 when he checks in AM. He did not bring his meter in today.  A1c last week was 10.6 - improved but still uncontrolled.  Plan: --continue Humulin 70/30 16 units BID --Start metformin 500mg  daily and titrate up to 1000mg  BID with meals --advised to check CBG twice daily before insulin administration and bring glucometer to f/u visits for further titration --per pharmacy, [facility] covers Janumet and Invokana so these are future options if another medication is indicated for better glucose control --f/u in 3 months for A1c and medication adjustment as necessary

## 2017-06-17 NOTE — Assessment & Plan Note (Signed)
Patient compliant with Xarelto daily, denies s/s of bleeding or blood clots. RRR on exam today and no signs of DVT.  Plan: --continue Xarelto

## 2017-06-17 NOTE — Assessment & Plan Note (Signed)
Patient with diagnosis of hypertension, however he has stopped taking HCTZ. At last visit, we discussed keeping a home BP log; patient states his wife checks his BP regularly but they have not written it down and he does not know the readings. He is asymptomatic.  On repeat measurement his BP is 136/74.  Plan: --will hold off on starting antihypertensives and BP normal today; his smoking cessation has likely contributed to this --encouraged continued smoking cessation --advised to keep home BP log and bring in to future visits.

## 2017-06-17 NOTE — Assessment & Plan Note (Signed)
Patient stopped smoking.  Plan: --encouraged continued cessation

## 2017-06-20 NOTE — Progress Notes (Signed)
Case discussed with Dr. Svalina at the time of the visit.  We reviewed the resident's history and exam and pertinent patient test results.  I agree with the assessment, diagnosis and plan of care documented in the resident's note. 

## 2017-06-24 ENCOUNTER — Other Ambulatory Visit: Payer: Self-pay | Admitting: Pharmacist

## 2017-06-24 DIAGNOSIS — I480 Paroxysmal atrial fibrillation: Secondary | ICD-10-CM

## 2017-06-24 DIAGNOSIS — E785 Hyperlipidemia, unspecified: Secondary | ICD-10-CM

## 2017-06-24 DIAGNOSIS — E119 Type 2 diabetes mellitus without complications: Secondary | ICD-10-CM

## 2017-06-24 DIAGNOSIS — Z794 Long term (current) use of insulin: Secondary | ICD-10-CM

## 2017-06-24 MED ORDER — METFORMIN HCL 1000 MG PO TABS: 1000.0000 mg | ORAL_TABLET | Freq: Two times a day (BID) | ORAL | 3 refills | Status: DC

## 2017-06-24 MED ORDER — ATORVASTATIN CALCIUM 40 MG PO TABS: 40.0000 mg | ORAL_TABLET | Freq: Every day | ORAL | 3 refills | Status: DC

## 2017-06-24 MED ORDER — INSULIN NPH ISOPHANE & REGULAR (70-30) 100 UNIT/ML ~~LOC~~ SUSP: SUBCUTANEOUS | 3 refills | Status: DC

## 2017-06-24 MED ORDER — RIVAROXABAN 20 MG PO TABS: 20.0000 mg | ORAL_TABLET | Freq: Every day | ORAL | 3 refills | Status: DC

## 2017-06-24 NOTE — Progress Notes (Signed)
[facility], sent prescriptions in to pharmacy.

## 2017-06-29 ENCOUNTER — Other Ambulatory Visit: Payer: Self-pay | Admitting: Internal Medicine

## 2017-06-29 DIAGNOSIS — E119 Type 2 diabetes mellitus without complications: Secondary | ICD-10-CM

## 2017-06-29 DIAGNOSIS — I10 Essential (primary) hypertension: Secondary | ICD-10-CM

## 2017-06-29 DIAGNOSIS — Z794 Long term (current) use of insulin: Secondary | ICD-10-CM

## 2017-07-13 ENCOUNTER — Encounter: Payer: Self-pay | Admitting: Internal Medicine

## 2017-11-02 ENCOUNTER — Other Ambulatory Visit: Payer: Self-pay | Admitting: *Deleted

## 2017-11-02 DIAGNOSIS — E119 Type 2 diabetes mellitus without complications: Secondary | ICD-10-CM

## 2017-11-02 MED ORDER — INSULIN NPH ISOPHANE & REGULAR (70-30) 100 UNIT/ML ~~LOC~~ SUSP: SUBCUTANEOUS | 3 refills | Status: DC

## 2017-11-17 ENCOUNTER — Encounter: Payer: Self-pay | Admitting: Internal Medicine

## 2017-11-17 ENCOUNTER — Ambulatory Visit: Payer: PRIVATE HEALTH INSURANCE

## 2018-02-02 ENCOUNTER — Ambulatory Visit: Payer: PRIVATE HEALTH INSURANCE

## 2018-02-13 ENCOUNTER — Other Ambulatory Visit: Payer: Self-pay | Admitting: Internal Medicine

## 2018-02-13 DIAGNOSIS — E119 Type 2 diabetes mellitus without complications: Secondary | ICD-10-CM

## 2018-02-16 NOTE — Telephone Encounter (Signed)
Called patient appt given for 04/12/2018 with Dr Samuella CotaSvalina (PCP)

## 2018-02-17 ENCOUNTER — Encounter: Payer: Self-pay | Admitting: Internal Medicine

## 2018-02-17 ENCOUNTER — Other Ambulatory Visit: Payer: Self-pay

## 2018-02-17 ENCOUNTER — Ambulatory Visit (INDEPENDENT_AMBULATORY_CARE_PROVIDER_SITE_OTHER): Payer: PRIVATE HEALTH INSURANCE | Admitting: Internal Medicine

## 2018-02-17 VITALS — BP 140/86 | HR 89 | Temp 97.9°F | Ht 73.0 in | Wt 198.3 lb

## 2018-02-17 DIAGNOSIS — F17201 Nicotine dependence, unspecified, in remission: Secondary | ICD-10-CM | POA: Diagnosis not present

## 2018-02-17 DIAGNOSIS — Z7901 Long term (current) use of anticoagulants: Secondary | ICD-10-CM

## 2018-02-17 DIAGNOSIS — Z0000 Encounter for general adult medical examination without abnormal findings: Secondary | ICD-10-CM

## 2018-02-17 DIAGNOSIS — I480 Paroxysmal atrial fibrillation: Secondary | ICD-10-CM | POA: Diagnosis not present

## 2018-02-17 DIAGNOSIS — E119 Type 2 diabetes mellitus without complications: Secondary | ICD-10-CM

## 2018-02-17 DIAGNOSIS — Z794 Long term (current) use of insulin: Secondary | ICD-10-CM

## 2018-02-17 LAB — POCT GLYCOSYLATED HEMOGLOBIN (HGB A1C): HbA1c POC (<> result, manual entry): >14 % — AB (ref 4.0–5.6)

## 2018-02-17 LAB — GLUCOSE, CAPILLARY: GLUCOSE-CAPILLARY: 295 mg/dL — AB (ref 70–99)

## 2018-02-17 MED ORDER — INSULIN NPH ISOPHANE & REGULAR (70-30) 100 UNIT/ML ~~LOC~~ SUSP: SUBCUTANEOUS | 3 refills | Status: DC

## 2018-02-17 NOTE — Assessment & Plan Note (Signed)
Uncontrolled. A1c >14 today.   We discussed different treatment options; [facility] covers insulin degludec and humalog as well as canagliflozin, januvia and janumet in addition to his medicines. He is not interested at this time in switching to long-acting and mealtime insulin due to his work schedule; he is ok with starting either [place]uvia or canagliflozin based on his renal function. We did discuss the side effects of each, in particular increased risk of UTI/yeast infections and fournier's gangrene with canagliflozin.   Plan: --increase Humulin to 35 units BID --continue metformin 1000mg  BID  --he will check CBGs 2-3 times a day and return to clinic in 2 wks with glucometer for further insulin titration --f/u Bmet - based on renal function may start canagliflozin --continue atorvastatin 40mg  daily for primary prevention --f/u microalb/cr ratio --foot exam performed today --he declined retinal scan today due to having to go to work soon

## 2018-02-17 NOTE — Progress Notes (Signed)
   CC: Diabetes  HPI:  Terry Castillo is a 52 y.o. with a PMH of T2DM, paroxysmal A fib, tobacco use disorder presenting to clinic for follow up on DM.  T2DM: Patient with uncontrolled T2DM with last A1c of 10.2 in Jan 2019; current regimen is Humulin 70/30 mix 30 units BID and metformin 1000mg  BID. He checks his CBGs almost every day and reports AM readings of 180-190. He denies symptoms of weakness, shakiness, nausea, vomiting and does not think he has had hypoglycemic episodes.   A fib: Patient with paroxysmal A fib on xarelto. He denies palpitations or signs of bleeding including gum bleeding, hematuria, hematochezia or melena. He denies chest pain or shortness of breath.  Please see problem based Assessment and Plan for status of patients chronic conditions.  Past Medical History:  Diagnosis Date  . Alcohol abuse   . Atrial fibrillation with RVR (HCC) 2006   spontaneous conversion to sinus rhythm  . Diabetes mellitus    uncontrolled  . Onychomycosis   . Tobacco abuse     Review of Systems:   Per HPI  Physical Exam:  Vitals:   02/17/18 1323  BP: 140/86  Pulse: 89  Temp: 97.9 F (36.6 C)  TempSrc: Oral  SpO2: 98%  Weight: 198 lb 4.8 oz (89.9 kg)  Height: 6\' 1"  (1.854 m)   GENERAL- alert, co-operative, appears as stated age, not in any distress. CARDIAC- RRR, no murmurs, rubs or gallops. RESP- Moving equal volumes of air, and clear to auscultation bilaterally, no wheezes or crackles. ABDOMEN- Soft, bowel sounds present. NEURO- CN 2-12 grossly intact. EXTREMITIES- pulse 2+ DP/PT, symmetric, no pedal edema. SKIN- Warm, dry, no rash or lesion. PSYCH- Normal mood and affect, appropriate thought content and speech.  Assessment & Plan:   See Encounters Tab for problem based charting.   Patient discussed with Dr. Cleda DaubE. Hoffman   Nyra MarketGorica Shady Bradish, MD Internal Medicine PGY-3

## 2018-02-17 NOTE — Progress Notes (Deleted)
   CC: ***  HPI:  [name] is a 52 y.o. with a PMH of T2DM, paroxysmal A fib, tobacco use disorder presenting to clinic for follow up on DM.  T2DM: Patient with uncontrolled T2DM with last A1c of 10.2 in Jan 2019; current regimen is Humulin 70/30 mix *** units BID. Metformin  HTN:  Please see problem based Assessment and Plan for status of patients chronic conditions.  Past Medical History:  Diagnosis Date  . Alcohol abuse   . Atrial fibrillation with RVR (HCC) 2006   spontaneous conversion to sinus rhythm  . Diabetes mellitus    uncontrolled  . Onychomycosis   . Tobacco abuse     Review of Systems:   ***  Physical Exam:  There were no vitals filed for this visit. GENERAL- alert, co-operative, appears as stated age, not in any distress. HEENT- Atraumatic, normocephalic, PERRL, EOMI, oral mucosa appears moist. CARDIAC- RRR, no murmurs, rubs or gallops. RESP- Moving equal volumes of air, and clear to auscultation bilaterally, no wheezes or crackles. ABDOMEN- Soft, nontender, bowel sounds present. NEURO- CN 2-12 grossly intact. EXTREMITIES- pulse 2+, symmetric, no pedal edema. SKIN- Warm, dry, no rash or lesion. PSYCH- Normal mood and affect, appropriate thought content and speech.  Assessment & Plan:   See Encounters Tab for problem based charting.   Patient {GC/GE:[id number]::"discussed with","seen with"} Dr. {NAMES:[id number]::"Butcher","Granfortuna","E. Hoffman","Klima","Mullen","Narendra","Raines","Vincent"}   Terry MarketGorica Cyncere Sontag, MD Internal Medicine PGY-3

## 2018-02-17 NOTE — Assessment & Plan Note (Signed)
Patient states he is still not smoking. I encouraged continued cessation.

## 2018-02-17 NOTE — Assessment & Plan Note (Signed)
Patient has insurance now but still having trouble affording copays for meds; uncertain of what his co-pays for cancer screens might be.   We will start addressing colon cancer screening options at f/u visit.

## 2018-02-17 NOTE — Assessment & Plan Note (Signed)
Stable, no signs of bleeding.  Plan: --continue xarelto 20mg  daily

## 2018-02-17 NOTE — Patient Instructions (Signed)
Please increase your insulin to 35 units twice daily; check your sugars first 2-3 times a day for the next couple of weeks; please come back in about 2 weeks with your glucometer to we can further adjust your medications.  I will check blood work today and if we are able to with your kidney function, we might start a new medication. I will call your about it.

## 2018-02-18 LAB — BMP8+ANION GAP
Anion Gap: 17 mmol/L (ref 10.0–18.0)
BUN / CREAT RATIO: 14 (ref 9–20)
BUN: 14 mg/dL (ref 6–24)
CHLORIDE: 99 mmol/L (ref 96–106)
CO2: 20 mmol/L (ref 20–29)
Calcium: 9.1 mg/dL (ref 8.7–10.2)
Creatinine, Ser: 0.97 mg/dL (ref 0.76–1.27)
GFR calc non Af Amer: 89 mL/min/{1.73_m2} (ref >59)
GFR, EST AFRICAN AMERICAN: 103 mL/min/{1.73_m2} (ref >59)
GLUCOSE: 299 mg/dL — AB (ref 65–99)
Potassium: 4.6 mmol/L (ref 3.5–5.2)
SODIUM: 136 mmol/L (ref 134–144)

## 2018-02-18 LAB — CBC
Hematocrit: 42.2 % (ref 37.5–51.0)
Hemoglobin: 14.2 g/dL (ref 13.0–17.7)
MCH: 29.3 pg (ref 26.6–33.0)
MCHC: 33.6 g/dL (ref 31.5–35.7)
MCV: 87 fL (ref 79–97)
Platelets: 187 10*3/uL (ref 150–450)
RBC: 4.84 x10E6/uL (ref 4.14–5.80)
RDW: 14.4 % (ref 12.3–15.4)
WBC: 6.4 10*3/uL (ref 3.4–10.8)

## 2018-02-18 LAB — MICROALBUMIN / CREATININE URINE RATIO
CREATININE, UR: 141.2 mg/dL
MICROALBUM., U, RANDOM: 27.1 ug/mL
Microalb/Creat Ratio: 19.2 mg/g creat (ref 0.0–30.0)

## 2018-02-20 NOTE — Progress Notes (Signed)
Internal Medicine Clinic Attending  Case discussed with Dr. Svalina  at the time of the visit.  We reviewed the resident's history and exam and pertinent patient test results.  I agree with the assessment, diagnosis, and plan of care documented in the resident's note.  

## 2018-02-24 ENCOUNTER — Encounter: Payer: Self-pay | Admitting: Internal Medicine

## 2018-04-12 ENCOUNTER — Encounter: Payer: PRIVATE HEALTH INSURANCE | Admitting: Internal Medicine

## 2018-06-15 ENCOUNTER — Ambulatory Visit: Payer: PRIVATE HEALTH INSURANCE | Admitting: Pharmacist

## 2018-06-15 DIAGNOSIS — Z79899 Other long term (current) drug therapy: Secondary | ICD-10-CM

## 2018-06-15 NOTE — Progress Notes (Signed)
Patient was seen with pharmacy student, Arelia Sneddon for help with patient assistance application for medication access.

## 2018-07-11 ENCOUNTER — Other Ambulatory Visit: Payer: Self-pay | Admitting: Pharmacist

## 2018-07-11 DIAGNOSIS — E119 Type 2 diabetes mellitus without complications: Secondary | ICD-10-CM

## 2018-07-11 DIAGNOSIS — Z794 Long term (current) use of insulin: Secondary | ICD-10-CM

## 2018-07-11 DIAGNOSIS — E785 Hyperlipidemia, unspecified: Secondary | ICD-10-CM

## 2018-07-11 DIAGNOSIS — I480 Paroxysmal atrial fibrillation: Secondary | ICD-10-CM

## 2018-07-11 MED ORDER — INSULIN NPH ISOPHANE & REGULAR (70-30) 100 UNIT/ML ~~LOC~~ SUSP: SUBCUTANEOUS | 3 refills | Status: DC

## 2018-07-11 MED ORDER — RIVAROXABAN 20 MG PO TABS: 20.0000 mg | ORAL_TABLET | Freq: Every day | ORAL | 3 refills | Status: DC

## 2018-07-11 MED ORDER — METFORMIN HCL 1000 MG PO TABS: 1000.0000 mg | ORAL_TABLET | Freq: Two times a day (BID) | ORAL | 3 refills | Status: DC

## 2018-07-11 MED ORDER — ATORVASTATIN CALCIUM 40 MG PO TABS: 40.0000 mg | ORAL_TABLET | Freq: Every day | ORAL | 3 refills | Status: DC

## 2018-07-11 NOTE — Progress Notes (Signed)
Patient was approved for [facility], prescriptions transferred.

## 2018-08-08 NOTE — Progress Notes (Signed)
   CC: T2DM  HPI:  Terry Castillo is a 53 y.o. with a PMH of T2DM, paroxysmal A fib presenting to clinic for follow up on T2DM.  T2DM: Patient endorses compliance with metformin 1000mg  BID, Humulin 70/30 mix 35 units BID. He checks his CBGs most days on waking up with readings ~180. He denies hypoglycemic symptoms for at least 1 year. He denies polyuria, polyphagia, polydipsia.  A fib: Patient denies palpitations, chest pain, shortness of breath, LE swelling, dizziness, syncope. He endorses compliance with xarelto 20mg  daily and denies mucosal bleeding, melena, hematochezia, hematuria, easy bruising.  Please see problem based Assessment and Plan for status of patients chronic conditions.  Past Medical History:  Diagnosis Date  . Alcohol abuse   . Atrial fibrillation with RVR (HCC) 2006   spontaneous conversion to sinus rhythm  . Diabetes mellitus    uncontrolled  . History of tobacco use   . Onychomycosis     Review of Systems:   Per HPI  Physical Exam:  Vitals:   08/09/18 1355  BP: 140/80  Pulse: 85  Temp: 98.5 F (36.9 C)  TempSrc: Oral  SpO2: 99%  Weight: 200 lb (90.7 kg)  Height: 6\' 1"  (1.854 m)   GENERAL- alert, co-operative, appears as stated age, not in any distress. CARDIAC- RRR, no murmurs, rubs or gallops. RESP- Moving equal volumes of air, and clear to auscultation bilaterally, no wheezes or crackles. ABDOMEN- Soft, nontender, bowel sounds present. NEURO- CN 2-12 grossly intact. EXTREMITIES- pulse 2+ DP/PT, symmetric, no pedal edema. SKIN- callus on lateral, plantar surface of right foot with mild TTP, w/o surrounding erythema, induration or warmth.  PSYCH- Normal mood and affect, appropriate thought content and speech.  Assessment & Plan:   See Encounters Tab for problem based charting.   Patient discussed with Dr. Valla Leaver, MD Internal Medicine PGY-3

## 2018-08-09 ENCOUNTER — Encounter (INDEPENDENT_AMBULATORY_CARE_PROVIDER_SITE_OTHER): Payer: Self-pay

## 2018-08-09 ENCOUNTER — Ambulatory Visit (INDEPENDENT_AMBULATORY_CARE_PROVIDER_SITE_OTHER): Payer: Self-pay | Admitting: Internal Medicine

## 2018-08-09 ENCOUNTER — Other Ambulatory Visit: Payer: Self-pay

## 2018-08-09 ENCOUNTER — Encounter: Payer: Self-pay | Admitting: Internal Medicine

## 2018-08-09 VITALS — BP 140/80 | HR 85 | Temp 98.5°F | Ht 73.0 in | Wt 200.0 lb

## 2018-08-09 DIAGNOSIS — E785 Hyperlipidemia, unspecified: Secondary | ICD-10-CM

## 2018-08-09 DIAGNOSIS — I10 Essential (primary) hypertension: Secondary | ICD-10-CM

## 2018-08-09 DIAGNOSIS — I480 Paroxysmal atrial fibrillation: Secondary | ICD-10-CM

## 2018-08-09 DIAGNOSIS — Z794 Long term (current) use of insulin: Secondary | ICD-10-CM

## 2018-08-09 DIAGNOSIS — Z79899 Other long term (current) drug therapy: Secondary | ICD-10-CM

## 2018-08-09 DIAGNOSIS — E119 Type 2 diabetes mellitus without complications: Secondary | ICD-10-CM

## 2018-08-09 DIAGNOSIS — Z7901 Long term (current) use of anticoagulants: Secondary | ICD-10-CM

## 2018-08-09 DIAGNOSIS — L84 Corns and callosities: Secondary | ICD-10-CM

## 2018-08-09 DIAGNOSIS — E782 Mixed hyperlipidemia: Secondary | ICD-10-CM

## 2018-08-09 LAB — POCT GLYCOSYLATED HEMOGLOBIN (HGB A1C): Hemoglobin A1C: 13.1 % — AB (ref 4.0–5.6)

## 2018-08-09 LAB — GLUCOSE, CAPILLARY: Glucose-Capillary: 215 mg/dL — ABNORMAL HIGH (ref 70–99)

## 2018-08-09 MED ORDER — CANAGLIFLOZIN 100 MG PO TABS: 100.0000 mg | ORAL_TABLET | Freq: Every day | ORAL | 2 refills | Status: DC

## 2018-08-09 NOTE — Patient Instructions (Signed)
Please start taking canagliflozin 100mg  once daily to help your blood sugar control. This medicine will make you urinate more so I would take it first thing in the morning.   We will send a referral to podiatry to help with your bunion.  Please come back in 1 month; check your sugars 3-4 times a day as this will help [place] adjust your insulin easier.

## 2018-08-10 ENCOUNTER — Encounter: Payer: Self-pay | Admitting: Internal Medicine

## 2018-08-10 NOTE — Assessment & Plan Note (Signed)
Regular rhythm and rate on exam. Asymptomatic and no signs of bleeding.   Plan: --continue xarelto 20mg  daily

## 2018-08-10 NOTE — Assessment & Plan Note (Signed)
Asymptomatic. Not on antihypertensives.  Plan: --start canagliflozin as this has been shown to decrease SBP slightly

## 2018-08-10 NOTE — Progress Notes (Signed)
Internal Medicine Clinic Attending  Case discussed with Dr. Svalina  at the time of the visit.  We reviewed the resident's history and exam and pertinent patient test results.  I agree with the assessment, diagnosis, and plan of care documented in the resident's note.  

## 2018-08-10 NOTE — Assessment & Plan Note (Signed)
On atorvastatin, denies myalgias.  Plan: --continue atorvastatin 40mg  daily

## 2018-08-10 NOTE — Assessment & Plan Note (Addendum)
A1c trending down to detectable at 13.1; he states compliance with medications and no hypoglycemic symptoms.  After discussion of risks (Fournier's gangrene, dehydration) and benefits (improved glycemic control, reduction in BP) patient has agreed to start canagliflozin 100mg  daily.  We also discussed the possibility of changing insulin to basal/bolus dosing, however he declines today.  Callous found on foot exam today.  Plan: --Start canagliflozin 100mg  daily --continue metformin 1000mg  BID --continue Humulin 35 units BID --refer to podiatry for callous --f/u in 1 month; advised to check CBGs 2-3 times per day and bring glucometer to possibly adjust insulin dosage

## 2018-08-11 ENCOUNTER — Encounter: Payer: Self-pay | Admitting: *Deleted

## 2018-09-14 NOTE — Addendum Note (Signed)
Addended by: Neomia Dear on: 09/14/2018 02:14 PM   Modules accepted: Orders

## 2018-10-18 ENCOUNTER — Other Ambulatory Visit: Payer: Self-pay

## 2018-10-18 ENCOUNTER — Ambulatory Visit (INDEPENDENT_AMBULATORY_CARE_PROVIDER_SITE_OTHER): Payer: Self-pay | Admitting: Internal Medicine

## 2018-10-18 ENCOUNTER — Encounter: Payer: Self-pay | Admitting: Internal Medicine

## 2018-10-18 VITALS — BP 139/81 | HR 95 | Temp 97.7°F | Ht 71.0 in | Wt 196.1 lb

## 2018-10-18 DIAGNOSIS — Z0000 Encounter for general adult medical examination without abnormal findings: Secondary | ICD-10-CM

## 2018-10-18 DIAGNOSIS — E119 Type 2 diabetes mellitus without complications: Secondary | ICD-10-CM

## 2018-10-18 DIAGNOSIS — I480 Paroxysmal atrial fibrillation: Secondary | ICD-10-CM

## 2018-10-18 DIAGNOSIS — Z794 Long term (current) use of insulin: Secondary | ICD-10-CM

## 2018-10-18 DIAGNOSIS — Z7901 Long term (current) use of anticoagulants: Secondary | ICD-10-CM

## 2018-10-18 LAB — POCT GLYCOSYLATED HEMOGLOBIN (HGB A1C): Hemoglobin A1C: 13.4 % — AB (ref 4.0–5.6)

## 2018-10-18 LAB — GLUCOSE, CAPILLARY: Glucose-Capillary: 307 mg/dL — ABNORMAL HIGH (ref 70–99)

## 2018-10-18 MED ORDER — GLUCOSE BLOOD VI STRP: ORAL_STRIP | 5 refills | Status: DC

## 2018-10-18 MED ORDER — CANAGLIFLOZIN 100 MG PO TABS: 100.0000 mg | ORAL_TABLET | Freq: Every day | ORAL | 2 refills | Status: DC

## 2018-10-18 MED ORDER — LANCETS MISC: 5 refills | Status: DC

## 2018-10-18 NOTE — Progress Notes (Signed)
   CC: T2DM  HPI:  [name] is a 53 y.o. with a PMH of T2DM, paroxysmal A fib presenting to clinic for follow up on T2DM.  Please see problem based Assessment and Plan for status of patients chronic conditions.  Past Medical History:  Diagnosis Date  . Alcohol abuse   . Atrial fibrillation with RVR (HCC) 2006   spontaneous conversion to sinus rhythm  . Diabetes mellitus    uncontrolled  . History of tobacco use   . Onychomycosis     Review of Systems:   Patient denies nausea, vomiting, sweats, chest pain, shortness of breath, palpitations, LE swelling, pain or drainage at site of callous on foot; denies hematuria, dysuria, fevers, chills, melena, hematochezia.  Physical Exam:  Vitals:   10/18/18 1330  BP: 139/81  Pulse: 95  Temp: 97.7 F (36.5 C)  TempSrc: Oral  SpO2: 100%  Weight: 196 lb 1.6 oz (89 kg)  Height: 5\' 11"  (1.803 m)   GENERAL- alert, co-operative, appears as stated age, not in any distress. HEENT- oral mucosa appears moist. CARDIAC- RRR, no murmurs, rubs or gallops. RESP- Moving equal volumes of air, and clear to auscultation bilaterally, no wheezes or crackles. ABDOMEN- Soft, nontender, bowel sounds present. EXTREMITIES- pulse 2+, symmetric, no pedal edema. R foot callous with mild tenderness to palpation  Assessment & Plan:   See Encounters Tab for problem based charting.   Patient discussed with Dr. Cherene Altes, MD Internal Medicine PGY-3

## 2018-10-19 ENCOUNTER — Encounter: Payer: Self-pay | Admitting: Internal Medicine

## 2018-10-19 NOTE — Assessment & Plan Note (Signed)
Compliant with xarelto; denies palpitations or signs of bleeding.  Plan: -continue xarelto

## 2018-10-19 NOTE — Progress Notes (Signed)
Internal Medicine Clinic Attending  Case discussed with Dr. Svalina  at the time of the visit.  We reviewed the resident's history and exam and pertinent patient test results.  I agree with the assessment, diagnosis, and plan of care documented in the resident's note.  

## 2018-10-19 NOTE — Assessment & Plan Note (Signed)
States he infrequently takes canagliflozin due to increased urinary frequency and his schedule. He denies symptoms of hyper or hypoglycemia.   He again declines switching his insulin; we discussed how he can adjust timing of the canagliflozin vs changing to a different medicine and he elected to continue on canagliflozin for now.  Callous on foot stable. He cannot afford to pay out of pocket for podiatry but thinks he has insurance. I asked him to check with his employer and provide an insurance card so he can have podiatry address the callous.   A1c unchanged today. He states he lost his meter so unable to check CBGs  Plan: -restart canagliflozin 100mg  daily -continue metformin 1000mg  BID -continue Humulin 35u BID -given meter, script for lancets and strips given -continue atorvastatin for primary prevention -f/u in 3 months

## 2018-10-19 NOTE — Assessment & Plan Note (Signed)
Discussed recommended screening labs/procedures. He doesn't have proof of insurance at this time and states he cannot afford out of pocket cost. He will contact his employer about insurance as he thinks he should have coverage.

## 2018-11-09 ENCOUNTER — Encounter: Payer: Self-pay | Admitting: *Deleted

## 2018-11-23 ENCOUNTER — Other Ambulatory Visit: Payer: Self-pay

## 2018-11-23 ENCOUNTER — Ambulatory Visit: Payer: Self-pay | Admitting: Pharmacist

## 2018-11-23 DIAGNOSIS — E119 Type 2 diabetes mellitus without complications: Secondary | ICD-10-CM

## 2018-11-23 DIAGNOSIS — Z794 Long term (current) use of insulin: Secondary | ICD-10-CM

## 2018-11-23 NOTE — Progress Notes (Signed)
Patient reported for help with diabetes medications. Worked with patient to re-enroll in [facility] and provided samples:  Novolog 70/30 Pen  insulin aspart and insulin aspart protamine pen 100 units / mL November 2020 [id number]   We also discussed hypoglycemia for hypoglycemia initiative:  11/23/2018 Name: Terry Castillo  MRN: [id number] DOB: 31-Jul-1965    Patient Initials: GT AGE: 53 y.o. GENDER: male  ETHNICITY: African American Patient is on antihyperglycemic medication(s) that carry hypoglycemic risk. Patient qualifies for quality improvement initiative of assessing baseline knowledge of hypoglycemia and filling education gaps, along with medication review and optimization.   Verbal consent to conduct survey received: '[x]'$  Yes  '[]'$  No  LABS: Document most recent values (within previous 12 months), but should be at least 3-6 months apart) Height (in):5'11''        Weight (Kg):89            BMI:27.35 Date/eGFR: 02/17/18, 103   Date/UACR: 02/17/18 '[x]'$  <30  '[]'$  30-300 '[]'$   >300 '[]'$  Not available Date/CrCL:02/17/18, 99  Date/eGFR:06/28/14, >89    Date/HbA1c:10/18/18, 13.4 Date/HbA1c:08/09/18 13.1    There is no height or weight on file to calculate BMI.  CrCl cannot be calculated (Patient's most recent lab result is older than the maximum 21 days allowed.).  CrCl cannot be calculated (Patient's most recent lab result is older than the maximum 21 days allowed.). The ASCVD Risk score Mikey Bussing DC Jr., et al., 2013) failed to calculate for the following reasons:   Cannot find a previous HDL lab   Cannot find a previous total cholesterol lab 10-y ASCVD risk: 20.9%  Allergies: No Known Allergies  Medications:   Current Outpatient Medications:  .  atorvastatin (LIPITOR) 40 MG tablet, Take 1 tablet (40 mg total) by mouth daily., Disp: 90 tablet, Rfl: 3 .  Blood Glucose Monitoring Suppl (WAVESENSE AMP) W/DEVICE KIT, Use to test blood glucose 3 times per day or as needed., Disp: 1 kit, Rfl: 0 .   canagliflozin (INVOKANA) 100 MG TABS tablet, Take 1 tablet (100 mg total) by mouth daily before breakfast., Disp: 30 tablet, Rfl: 2 .  glucose blood (CONTOUR NEXT TEST) test strip, Use to test blood sugar 2-3 times per day, Disp: 100 each, Rfl: 5 .  insulin NPH-regular Human (HUMULIN 70/30) (70-30) 100 UNIT/ML injection, INJECT 35 UNITS UNDER THE SKIN TWICE DAILY WITH A MEAL, Disp: 60 mL, Rfl: 3 .  Insulin Syringe-Needle U-100 (SM INSULIN SYRINGE) 31G X 5/16" 0.5 ML MISC, Inject 1 Syringe into the skin 2 (two) times daily. Use with Novolog 7030 as instructed 2(twice) daiy, Disp: 100 each, Rfl: 11 .  Lancet Devices MISC, Use to check blood sugar 3 times a day , Disp: , Rfl:  .  Lancets MISC, Use to test blood sugar 2-3 times per day, Disp: 100 each, Rfl: 5 .  metFORMIN (GLUCOPHAGE) 1000 MG tablet, Take 1 tablet (1,000 mg total) by mouth 2 (two) times daily with a meal., Disp: 180 tablet, Rfl: 3 .  rivaroxaban (XARELTO) 20 MG TABS tablet, Take 1 tablet (20 mg total) by mouth daily with supper., Disp: 90 tablet, Rfl: 3  Patient interview: What is your smoking status? '[]'$  Never '[x]'$  Past  '[]'$  Current (w/in past 2 mo)  What is your current living situation? '[x]'$  Home '[]'$  Homeless  '[]'$  Shelter '[]'$  Other: What type of diabetes do you have? '[]'$  Type 1 '[x]'$  Type 2  '[]'$  Not Sure   How long have you had diabetes? 15 years  Have you ever heard the term "hypoglycemia"? '[]'$  Yes '[x]'$  No  '[]'$  Not Sure   Have you ever had hypoglycemia where you felt hungry, shaky, sweaty, or other symptoms?'[x]'$  Yes '[]'$  No  '[]'$  Not Sure   Have you ever received education about hypoglycemia?'[x]'$  Yes '[]'$  No  '[]'$  Not Sure   In the past month, have you had a hypoglycemic episode?  '[]'$  Yes '[x]'$  No  '[]'$  Not Sure   What symptoms did you feel? About 1 year ago, had cold sweats How did you treat the hypoglycemic episode(s)?peppermint candy Do you have a glucose source with you in case you have hypoglycemia when you go out? '[x]'$  Yes '[]'$  No  '[]'$  Not Sure  Source:   What is your meal schedule? '[]'$  Once-daily '[x]'$  Twice-daily  '[]'$  Three times daily  '[]'$  ? four times daily '[]'$  Erratic What is your snack schedule? '[x]'$  None '[]'$  Once-daily '[]'$  Twice-daily  '[]'$  Three times daily  '[]'$  ? four times daily '[]'$  Erratic How many times have you EVER, in your entire life, gone to any ED or any hospital because of a hypoglycemia? '[x]'$  None '[]'$  1-2 '[]'$  ? 3 In the past 12 months, how many times have you gone to any ED for ANY reason?  (ex. infection, car accident, back pain) '[x]'$  None '[]'$  1 '[]'$  ? 2  Intervention: Patient '[x]'$  can '[]'$  cannot articulate what hypoglycemia is.  Patient extensively counseled on the signs and symptoms of hypoglycemia. Patient counseled on the rule of 15 carbs and to recheck blood glucose in 15 minutes to make sure hypoglycemia resolving. Patient encouraged to track future hypoglycemia events and discuss with practitioner or seek care as needed.     Patient is not having challenges with hypoglycemia at this time.

## 2018-12-08 ENCOUNTER — Telehealth: Payer: Self-pay | Admitting: Pharmacist

## 2018-12-08 DIAGNOSIS — E119 Type 2 diabetes mellitus without complications: Secondary | ICD-10-CM

## 2018-12-08 DIAGNOSIS — E785 Hyperlipidemia, unspecified: Secondary | ICD-10-CM

## 2018-12-08 DIAGNOSIS — I480 Paroxysmal atrial fibrillation: Secondary | ICD-10-CM

## 2018-12-08 MED ORDER — RIVAROXABAN 20 MG PO TABS: 20.0000 mg | ORAL_TABLET | Freq: Every day | ORAL | 3 refills | Status: DC

## 2018-12-08 MED ORDER — ATORVASTATIN CALCIUM 40 MG PO TABS: 40.0000 mg | ORAL_TABLET | Freq: Every day | ORAL | 3 refills | Status: DC

## 2018-12-08 MED ORDER — HUMULIN 70/30 (70-30) 100 UNIT/ML ~~LOC~~ SUSP: SUBCUTANEOUS | 3 refills | Status: DC

## 2018-12-08 MED ORDER — METFORMIN HCL 1000 MG PO TABS: 1000.0000 mg | ORAL_TABLET | Freq: Two times a day (BID) | ORAL | 3 refills | Status: DC

## 2018-12-08 MED ORDER — CANAGLIFLOZIN 100 MG PO TABS: 100.0000 mg | ORAL_TABLET | Freq: Every day | ORAL | 2 refills | Status: DC

## 2018-12-08 NOTE — Telephone Encounter (Signed)
Patient was approved for free medications from [facility] [place] pharmacy. A1C has been >10% since 2018. Patient states he does not know how to check BG at home, so offered patient CGM program and patient accepted. Appointment scheduled for 7/16 for CGM placement. Will also review home BG monitoring with patient at that visit and notify PCP of all programs.  Prescriptions were transferred to [facility] pharmacy for patient access

## 2018-12-14 ENCOUNTER — Ambulatory Visit (INDEPENDENT_AMBULATORY_CARE_PROVIDER_SITE_OTHER): Payer: Self-pay | Admitting: Dietician

## 2018-12-14 ENCOUNTER — Ambulatory Visit: Payer: Self-pay | Admitting: Pharmacist

## 2018-12-14 ENCOUNTER — Other Ambulatory Visit: Payer: Self-pay

## 2018-12-14 ENCOUNTER — Ambulatory Visit: Payer: Self-pay | Admitting: Dietician

## 2018-12-14 ENCOUNTER — Encounter: Payer: Self-pay | Admitting: Dietician

## 2018-12-14 DIAGNOSIS — E119 Type 2 diabetes mellitus without complications: Secondary | ICD-10-CM

## 2018-12-14 DIAGNOSIS — Z794 Long term (current) use of insulin: Secondary | ICD-10-CM

## 2018-12-14 NOTE — Progress Notes (Signed)
Documentation for Freestyle Libre Pro Continuous glucose monitoring Terry Castillo was educated about the [employer] CGM and he agreed to have a sensor placed today. Patient was educated about wearing sensor, keeping food, activity and medication log and when to call office. Patient was educated about how to care for the sensor and not to have an MRI, CT or Diathermy while wearing the sensor and to contnuie to check his blood sugar as usual. Follow up was arranged with the patient for 1 week.   He ate lunch (2 hot dogs and a regular can of Coke) at noon and did not take insulin yet today. He said he planned to take it when he got home. He was advised not to take his am dose this much later after a meal but to get back on schedule with his dinner dose and meal. It was explained that this can cause hypoglycemia. He verbalized understanding.   Lot #: [id number] A Serial #: [id number] Expiration Date: 03/31/19  [name], RD 12/14/2018 3:54 PM.

## 2018-12-14 NOTE — Patient Instructions (Signed)
Please record the time, amount and what food drinks and activities you have while wearing the continuous glucose monitor (CGM).  Bring the folder with you to follow up appointments. If your monitor falls off, please place it in the bag provided in your folder and bring it back with you to your next appointment.   Do not have a CT or an MRI while wearing the CGM.   Please stop at the front dfesk and schedule a 1 week visit with me and a doctor for the first of two CGM downloads.   You will also return in 2 weeks to have your second download and the CGM removed.  [name] [phone]

## 2018-12-21 ENCOUNTER — Encounter: Payer: Self-pay | Admitting: Internal Medicine

## 2018-12-21 ENCOUNTER — Ambulatory Visit: Payer: Self-pay | Admitting: Dietician

## 2018-12-21 ENCOUNTER — Encounter: Payer: Self-pay | Admitting: Dietician

## 2018-12-21 ENCOUNTER — Other Ambulatory Visit: Payer: Self-pay

## 2018-12-21 ENCOUNTER — Ambulatory Visit (INDEPENDENT_AMBULATORY_CARE_PROVIDER_SITE_OTHER): Payer: Self-pay | Admitting: Internal Medicine

## 2018-12-21 VITALS — Ht 73.0 in | Wt 204.0 lb

## 2018-12-21 DIAGNOSIS — Z794 Long term (current) use of insulin: Secondary | ICD-10-CM

## 2018-12-21 DIAGNOSIS — E119 Type 2 diabetes mellitus without complications: Secondary | ICD-10-CM

## 2018-12-21 MED ORDER — LANTUS SOLOSTAR 100 UNIT/ML ~~LOC~~ SOPN: 30.0000 [IU] | PEN_INJECTOR | Freq: Every day | SUBCUTANEOUS | 0 refills | Status: DC

## 2018-12-21 MED ORDER — INSULIN LISPRO 100 UNIT/ML ~~LOC~~ SOLN: 20.0000 [IU] | Freq: Two times a day (BID) | SUBCUTANEOUS | 3 refills | Status: DC

## 2018-12-21 MED FILL — LANTUS SOLOSTAR 100 UNITS/M: 100 | 30 days supply | Qty: 9 | Fill #0

## 2018-12-21 NOTE — Progress Notes (Signed)
Diabetes Self-Management Education  Visit Type: First/Initial  Appt. Start Time: 1335 Appt. End Time: 1350  12/21/2018  Terry Castillo, identified by name and date of birth, is a 53 y.o. male with a diagnosis of Diabetes: Type 2.   ASSESSMENT  Coordinated care of patient with Dr. Sharon Seller.  Lab Results  Component Value Date   HGBA1C 13.4 (A) 10/18/2018   HGBA1C 13.1 (A) 08/09/2018   HGBA1C >14.0 (A) 02/17/2018   HGBA1C 10.2 06/09/2017   HGBA1C 12.6 01/19/2017      Diabetes Self-Management Education - 12/21/18 1500      Visit Information   Visit Type  First/Initial      Initial Visit   Diabetes Type  Type 2    Are you currently following a meal plan?  Yes    What type of meal plan do you follow?  2-3 meals/day, no sugar beverages    Are you taking your medications as prescribed?  Yes      Psychosocial Assessment   Patient Belief/Attitude about Diabetes  Motivated to manage diabetes    Self-care barriers  Lack of transportation;Lack of material resources    Self-management support  Doctor's office;CDE visits    Patient Concerns  Support    Special Needs  None    Preferred Learning Style  Hands on    Learning Readiness  Ready      Pre-Education Assessment   Patient understands monitoring blood glucose, interpreting and using results  Needs Review    Patient understands prevention, detection, and treatment of acute complications.  Needs Review    Patient understands prevention, detection, and treatment of chronic complications.  Needs Review      Complications   Last HgB A1C per patient/outside source  13.4 %    How often do you check your blood sugar?  0 times/day (not testing)    Fasting Blood glucose range (mg/dL)  >200    Postprandial Blood glucose range (mg/dL)  >200    Number of hypoglycemic episodes per month  0    Number of hyperglycemic episodes per week  0      Dietary Intake   Breakfast  10am-12 noon a sandwich and coffee or hot tea    Lunch  3PM  cheeseburger    Dinner  1am- chuicken and rice, peas and carrots, water      Patient Education   Monitoring  Taught/evaluated SMBG meter.;Other (comment)   understanding cgm download education     Individualized Goals (developed by patient)   Monitoring   test my blood glucose as discussed      Outcomes   Expected Outcomes  Demonstrated interest in learning. Expect positive outcomes    Future DMSE  Other (comment)   1 week   Program Status  Not Completed       Individualized Plan for Diabetes Self-Management Training:   Learning Objective:  Patient will have a greater understanding of diabetes self-management. Patient education plan is to attend individual and/or group sessions per assessed needs and concerns.   Plan:   Patient Instructions  Terry Castillo,  You did a great job keeping records!! Thank you!  I made you an appointment to have the CGM removed.  [name] [phone]   Expected Outcomes:  Demonstrated interest in learning. Expect positive outcomes Education material provided: Diabetes Resources If problems or questions, patient to contact team via:  Phone Future DSME appointment: Other (comment)(1 week)  [name], RD 12/21/2018 3:09 PM.

## 2018-12-21 NOTE — Progress Notes (Signed)
   CC: CGM download for TIIDM  HPI:  Mr.Davion A Bommarito is a 53 y.o. with PMH as below.   Please see A&P for assessment of the patient's acute and chronic medical conditions.   Past Medical History:  Diagnosis Date  . Alcohol abuse   . Atrial fibrillation with RVR ([place]) 2006   spontaneous conversion to sinus rhythm  . Diabetes mellitus    uncontrolled  . History of tobacco use   . Onychomycosis    Review of Systems:   Review of Systems  Constitutional: Negative for diaphoresis and malaise/fatigue.  Gastrointestinal: Negative for abdominal pain, nausea and vomiting.  Genitourinary: Negative for frequency and urgency.  Neurological: Negative for dizziness, tingling and weakness.   Physical Exam:  Constitution: NAD, appears stated age Cardio: regular rate, no LE edema  Neuro: alert & oriented, normal affect Skin: c/d/i    Vitals:   12/21/18 1329  Weight: 204 lb (92.5 kg)  Height: 6\' 1"  (1.854 m)     Assessment & Plan:   See Encounters Tab for problem based charting.  Patient discussed with Dr. Evette Doffing

## 2018-12-21 NOTE — Assessment & Plan Note (Addendum)
He is here for CGM follow-up week 1. Terry Castillo wore the CGM for 7 days. The average reading was 210, % time in target was 28, % time below target was 4, and % time above target was 30.   Current medications include invokana 100 mg qd, humulin 70/30 35U bid, and metformin 1000 mg bid. He is currently taking his metformin 500 mg every other day, which he has been doing for some time as he worries about metformin causing hypoglycemia. Intervention will be to switch to lantus 30U qhs, novolog 20U bid with meals, and take metformin 500 mg bid. The patient will be scheduled to see 7 days for a final appointment. We discussed that metformin would be unlikely to cause hypoglycemia.   - Novolog through [facility] will take 1 week to arrive - given Fiasp samples and instructed to use less than five minutes before meal.

## 2018-12-21 NOTE — Patient Instructions (Addendum)
Mr. Dunshee,  Terry Castillo did a great job keeping records!! Thank you!  I made you an appointment to have the CGM removed.  [name] [phone]

## 2018-12-21 NOTE — Patient Instructions (Addendum)
Thank you for allowing [place] to provide your care today. Today we discussed your type II diabetes and CGM monitor.   Please return in 7 days with [place] for Download #2.     Today we made the following changes to your medications:   Please START taking  Insulin glargine (Lantus) - inject 30 Units prior to your bedtime   Fiasp (Insulin Aspart) - take 20U two times per day with meals - make sure to take immediately before meal  Another short acting insulin (insulin lispro) will be arriving through [facility] - do not take Fiasp and Lispro together  Please take your metformin 1000 mg - take one 1/2 tablet two times per day.   Please STOP taking   insulin NPH-regular Human (HUMULIN 70/30) (70-30) 100 UNIT/ML injection  Please follow-up in one week.    If you have symptoms of dizziness, sweating, or feeling lightheaded or confused please check your glucose. If below 70, make sure to drink some juice and eat and decrease your Novolog by 4 Units   Should you have any questions or concerns please call the internal medicine clinic at [phone].

## 2018-12-22 ENCOUNTER — Other Ambulatory Visit: Payer: Self-pay | Admitting: Internal Medicine

## 2018-12-22 DIAGNOSIS — Z794 Long term (current) use of insulin: Secondary | ICD-10-CM

## 2018-12-22 DIAGNOSIS — E119 Type 2 diabetes mellitus without complications: Secondary | ICD-10-CM

## 2018-12-22 MED ORDER — INSULIN PEN NEEDLE 32G X 4 MM MISC: 1.0000 "application " | Freq: Every day | 2 refills | Status: DC

## 2018-12-22 MED FILL — UNIFINE PENTIPS 32GX5/32: 32G X 4 MM | 33 days supply | Qty: 100 | Fill #0

## 2018-12-22 NOTE — Progress Notes (Signed)
Internal Medicine Clinic Attending  Case discussed with Dr. Sharon Seller at the time of the visit. We reviewed the resident's history and exam and pertinent patient test results. I personally reviewed the CGM data & the resident's interpretation. I agree with the assessment, diagnosis, and plan of care documented in the resident's note.

## 2018-12-28 ENCOUNTER — Encounter (INDEPENDENT_AMBULATORY_CARE_PROVIDER_SITE_OTHER): Payer: Self-pay

## 2018-12-28 ENCOUNTER — Ambulatory Visit (INDEPENDENT_AMBULATORY_CARE_PROVIDER_SITE_OTHER): Payer: Self-pay | Admitting: Pharmacist

## 2018-12-28 DIAGNOSIS — E119 Type 2 diabetes mellitus without complications: Secondary | ICD-10-CM

## 2018-12-28 DIAGNOSIS — Z794 Long term (current) use of insulin: Secondary | ICD-10-CM

## 2018-12-28 MED ORDER — METFORMIN HCL ER 500 MG PO TB24: 500.0000 mg | ORAL_TABLET | Freq: Two times a day (BID) | ORAL | 3 refills | Status: DC

## 2018-12-28 NOTE — Progress Notes (Signed)
Patient came in for CGM download #2.  Freestyle [employer] and Reviewed  Freestyle Libre sensor placed 7/16  Week #2 of monitoring  15-day BG average 200, within target range 36% of the time  Above 180 mg/dL 59% of the time, highest around 3pm  Below 70 mg/dL 4% of the time, lowest around 9pm  Below 54 mg/dL 1% of the time  Coefficient of variation 39.7%  Patient reports being on his mixed insulin during this 2 week period and taking Invokana.  Patient was advised to begin his long acting insulin at 30 units in the morning and his prandial insulin as 15 units before meals. Patient was advised that if he does not eat a meal, he should not take his meal time insulin.  Told the patient I would speak with Dr. Maudie Mercury regarding any further adjustments to his insulin and that we would give him a call.  Lorenso Courier, PharmD Candidate

## 2019-01-11 ENCOUNTER — Telehealth: Payer: Self-pay | Admitting: Pharmacist

## 2019-01-11 NOTE — Telephone Encounter (Signed)
Diabetes Management Follow Up Terry Castillo is a 53 y.o. male who was contacted for DM management. Identity was verified using date of birth.  Diabetes medications: Lantus 30 units QAM, Humalog 20 units BID with meals, Invokana 100mg ; metformin 500mg  BID   Patient reports being out of Humalog x1 week and has not been using the Lantus. Over the last week he reports using 25-30 units BID of his Novolin 70/30. He was not sure if he could take Lantus + Novolin and how many units to inject so since he didn't have the Humalog, he just switched himself back to Novolin 70/30.  Pt reports not checking his BG the last few days because he has been tired but reported the following numbers from memory from the last time he remembers checking Home BG average 192-260 mg/dL (correlates with A1C of around 10%) Hypoglycemia symptoms: none in the last 2 weeks Hyperglycemia symptoms: none  in the last 2 weeks   A/P Blood glucose control: stable  Reviewed appropriate home blood glucose monitoring. Discussed with the patient the importance of checking at least 1-2 times daily while being on insulin. Discussed checking once fasting and 2 hours after at least one meal.  Interventions today: - No medication changes today. Told pt to keep using ~30 units BID of Novolin until Humalog comes in from [facility]. Once Humalog arrives, pt is aware to d/c Novolin and use Lantus 30 units daily + Humalog 20 units BID with meals. Using the teach-back method, pt verbalized understanding of regimen and dosages. - Discussed skipping mealtime insulin dose if he does not eat a meal  Advised to contact clinic or seek medical attention if concerns or symptoms arise.  Will notify PCP of findings  The patient verbalized understanding of information provided by repeating back concepts discussed.  Follow-up Will follow-up via phone visit in 1-2 weeks  Kennon Holter, PharmD PGY1 Ambulatory Care Pharmacy Resident [employer]  Phone: [phone]

## 2019-01-12 NOTE — Telephone Encounter (Signed)
Thank you, Terry Castillo! Patient will be in clinic Monday to review meds.

## 2019-01-15 ENCOUNTER — Other Ambulatory Visit: Payer: Self-pay

## 2019-01-15 ENCOUNTER — Ambulatory Visit: Payer: Self-pay | Admitting: Pharmacist

## 2019-01-15 DIAGNOSIS — E119 Type 2 diabetes mellitus without complications: Secondary | ICD-10-CM

## 2019-01-15 DIAGNOSIS — Z794 Long term (current) use of insulin: Secondary | ICD-10-CM

## 2019-01-16 MED ORDER — XULTOPHY 100-3.6 UNIT-MG/ML ~~LOC~~ SOPN: 50.0000 [IU] | PEN_INJECTOR | Freq: Every day | SUBCUTANEOUS | 2 refills | Status: DC

## 2019-01-16 NOTE — Progress Notes (Signed)
S: Terry Castillo is a 52 y.o. male reports to clinical pharmacist appointment for DM management follow up. Patient did  bring medication bottles.   No Known Allergies  Current Outpatient Medications:  .  atorvastatin (LIPITOR) 40 MG tablet, Take 1 tablet (40 mg total) by mouth daily., Disp: 90 tablet, Rfl: 3 .  Blood Glucose Monitoring Suppl (WAVESENSE AMP) W/DEVICE KIT, Use to test blood glucose 3 times per day or as needed., Disp: 1 kit, Rfl: 0 .  canagliflozin (INVOKANA) 100 MG TABS tablet, Take 1 tablet (100 mg total) by mouth daily before breakfast., Disp: 30 tablet, Rfl: 2 .  glucose blood (CONTOUR NEXT TEST) test strip, Use to test blood sugar 2-3 times per day, Disp: 100 each, Rfl: 5 .  Insulin Glargine (LANTUS SOLOSTAR) 100 UNIT/ML Solostar Pen, Inject 30 Units into the skin at bedtime., Disp: 5 pen, Rfl: 0 .  Insulin Pen Needle 32G X 4 MM MISC, 1 application by Does not apply route daily., Disp: 100 each, Rfl: 2 .  Insulin Syringe-Needle U-100 (SM INSULIN SYRINGE) 31G X 5/16" 0.5 ML MISC, Inject 1 Syringe into the skin 2 (two) times daily. Use with Novolog 7030 as instructed 2(twice) daiy, Disp: 100 each, Rfl: 11 .  Lancet Devices MISC, Use to check blood sugar 3 times a day , Disp: , Rfl:  .  Lancets MISC, Use to test blood sugar 2-3 times per day, Disp: 100 each, Rfl: 5 .  metFORMIN (GLUCOPHAGE XR) 500 MG 24 hr tablet, Take 1 tablet (500 mg total) by mouth 2 (two) times daily with a meal., Disp: 180 tablet, Rfl: 3 .  rivaroxaban (XARELTO) 20 MG TABS tablet, Take 1 tablet (20 mg total) by mouth daily with supper., Disp: 90 tablet, Rfl: 3 Past Medical History:  Diagnosis Date  . Alcohol abuse   . Atrial fibrillation with RVR ([place]) 2006   spontaneous conversion to sinus rhythm  . Diabetes mellitus    uncontrolled  . History of tobacco use   . Onychomycosis    Social History   Socioeconomic History  . Marital status: Legally Separated    Spouse name: Not on file  . Number of  children: Not on file  . Years of education: 80  . Highest education level: Not on file  Occupational History  . Not on file  Social Needs  . Financial resource strain: Not on file  . Food insecurity    Worry: Not on file    Inability: Not on file  . Transportation needs    Medical: Not on file    Non-medical: Not on file  Tobacco Use  . Smoking status: Former Smoker    Packs/day: 0.25    Types: Cigarettes    Quit date: 04/16/2017    Years since quitting: 1.7  . Smokeless tobacco: Never Used  Substance and Sexual Activity  . Alcohol use: No    Alcohol/week: 0.0 standard drinks    Comment: Quit in 2009; now drinks ~2 cans beer per week  . Drug use: No  . Sexual activity: Not on file  Lifestyle  . Physical activity    Days per week: Not on file    Minutes per session: Not on file  . Stress: Not on file  Relationships  . Social Herbalist on phone: Not on file    Gets together: Not on file    Attends religious service: Not on file    Active member of club or  organization: Not on file    Attends meetings of clubs or organizations: Not on file    Relationship status: Not on file  Other Topics Concern  . Not on file  Social History Narrative   Financial assistance approved for 100% discount at [facility] and has [facility] card per Bonna Gains October 3,2011 1:56PM      Dropped out of 11th grade in high school.   Used to work in [employer].   No family in [place] (2 sisters and a brother in [place])   Family History  Problem Relation Age of Onset  . Heart disease Mother 55       Myocardial infarction  . Diabetes Mother   . Hypertension Mother   . Cancer Father    O:    Component Value Date/Time   CHOL 202 (H) 11/01/2014 1450   HDL 60 11/01/2014 1450   LDLCALC 123 (H) 11/01/2014 1450   TRIG 96 11/01/2014 1450   GLUCOSE 299 (H) 02/17/2018 1401   GLUCOSE 78 06/28/2014 1457   HGBA1C 13.4 (A) 10/18/2018 1351   HGBA1C >14.0 (A) 02/17/2018 1343    NA 136 02/17/2018 1401   K 4.6 02/17/2018 1401   CL 99 02/17/2018 1401   CO2 20 02/17/2018 1401   BUN 14 02/17/2018 1401   CREATININE 0.97 02/17/2018 1401   CREATININE 0.94 06/28/2014 1457   CALCIUM 9.1 02/17/2018 1401   GFRNONAA 89 02/17/2018 1401   GFRNONAA >89 06/28/2014 1457   GFRAA 103 02/17/2018 1401   GFRAA >89 06/28/2014 1457   AST 14 06/28/2014 1457   ALT 13 06/28/2014 1457   WBC 6.4 02/17/2018 1401   WBC 6.1 06/28/2014 1457   HGB 14.2 02/17/2018 1401   HCT 42.2 02/17/2018 1401   PLT 187 02/17/2018 1401   TSH 0.570 06/28/2014 1457   Ht Readings from Last 2 Encounters:  12/21/18 '6\' 1"'$  (1.854 m)  10/18/18 '5\' 11"'$  (1.803 m)   Wt Readings from Last 2 Encounters:  12/21/18 204 lb (92.5 kg)  10/18/18 196 lb 1.6 oz (89 kg)   There is no height or weight on file to calculate BMI. BP Readings from Last 3 Encounters:  10/18/18 139/81  08/09/18 140/80  02/17/18 140/86   A/P: At last visit ~1 month ago, patient was advised to switch from Novolog mix 70/30 to basal/bolus regimen. However, patient has continued taking Novolog mix 70/30, 35 units twice daily. He states he was confused about insulin regimen.   He did not bring home BG meter today, but reports home BG in the 200s, no hypoglycemia or signs/symptoms of concern today. Switched patient to basal/GLP-1 regimen for simplicity and gave samples (Ozempic and [place]), if tolerated, will switch to [facility] for simplification. Will apply for the Xultophy patient assistance program in the meantime. He is also taking metformin 500 mg BID and canagliflozin 100 mg daily. Will consider titrating these at follow up if further BG lowering is needed (can also combine SGLT-2i and metformin for further simplification and apply for patient assistance program).   An after visit summary was provided and patient advised to follow up in 2 weeks or sooner if any changes in condition or questions regarding medications arise.   The patient  verbalized understanding of information provided by repeating back concepts discussed.

## 2019-01-29 ENCOUNTER — Ambulatory Visit: Payer: Self-pay | Admitting: Pharmacist

## 2019-02-02 ENCOUNTER — Ambulatory Visit: Payer: Self-pay | Admitting: Pharmacist

## 2019-02-02 DIAGNOSIS — E119 Type 2 diabetes mellitus without complications: Secondary | ICD-10-CM

## 2019-02-02 DIAGNOSIS — Z794 Long term (current) use of insulin: Secondary | ICD-10-CM

## 2019-02-02 MED ORDER — SYNJARDY XR 5-1000 MG PO TB24: 1.0000 | ORAL_TABLET | Freq: Two times a day (BID) | ORAL | 3 refills | Status: DC

## 2019-02-02 MED ORDER — LANCETS MISC: 5 refills | Status: DC

## 2019-02-02 MED ORDER — INSULIN PEN NEEDLE 32G X 4 MM MISC: 1.0000 "application " | Freq: Every day | 3 refills | Status: DC

## 2019-02-02 MED ORDER — XULTOPHY 100-3.6 UNIT-MG/ML ~~LOC~~ SOPN: 50.0000 [IU] | PEN_INJECTOR | Freq: Every day | SUBCUTANEOUS | 2 refills | Status: DC

## 2019-02-02 MED FILL — MICROLET LANCETS MISC: 33 days supply | Qty: 100 | Fill #0

## 2019-02-02 NOTE — Progress Notes (Signed)
S: Terry Castillo is a 53 y.o. male reports to clinical pharmacist appointment for diabetes management.  No Known Allergies  Current Outpatient Medications:  .  atorvastatin (LIPITOR) 40 MG tablet, Take 1 tablet (40 mg total) by mouth daily., Disp: 90 tablet, Rfl: 3 .  Blood Glucose Monitoring Suppl (WAVESENSE AMP) W/DEVICE KIT, Use to test blood glucose 3 times per day or as needed., Disp: 1 kit, Rfl: 0 .  Empagliflozin-metFORMIN HCl ER (SYNJARDY XR) 09-998 MG TB24, Take 1 tablet by mouth 2 (two) times daily with a meal., Disp: 90 tablet, Rfl: 3 .  glucose blood (CONTOUR NEXT TEST) test strip, Use to test blood sugar 2-3 times per day, Disp: 100 each, Rfl: 5 .  Insulin Degludec-Liraglutide (XULTOPHY) 100-3.6 UNIT-MG/ML SOPN, Inject 50 Units into the skin daily., Disp: 20 pen, Rfl: 2 .  Insulin Pen Needle 32G X 4 MM MISC, 1 application by Does not apply route daily., Disp: 100 each, Rfl: 3 .  Insulin Syringe-Needle U-100 (SM INSULIN SYRINGE) 31G X 5/16" 0.5 ML MISC, Inject 1 Syringe into the skin 2 (two) times daily. Use with Novolog 7030 as instructed 2(twice) daiy, Disp: 100 each, Rfl: 11 .  Lancets MISC, Use to test blood sugar 2-3 times per day, Disp: 100 each, Rfl: 5 .  rivaroxaban (XARELTO) 20 MG TABS tablet, Take 1 tablet (20 mg total) by mouth daily with supper., Disp: 90 tablet, Rfl: 3 Past Medical History:  Diagnosis Date  . Alcohol abuse   . Atrial fibrillation with RVR ([place]) 2006   spontaneous conversion to sinus rhythm  . Diabetes mellitus    uncontrolled  . History of tobacco use   . Onychomycosis    Social History   Socioeconomic History  . Marital status: Legally Separated    Spouse name: Not on file  . Number of children: Not on file  . Years of education: 51  . Highest education level: Not on file  Occupational History  . Not on file  Social Needs  . Financial resource strain: Not on file  . Food insecurity    Worry: Not on file    Inability: Not on file  .  Transportation needs    Medical: Not on file    Non-medical: Not on file  Tobacco Use  . Smoking status: Former Smoker    Packs/day: 0.25    Types: Cigarettes    Quit date: 04/16/2017    Years since quitting: 1.8  . Smokeless tobacco: Never Used  Substance and Sexual Activity  . Alcohol use: No    Alcohol/week: 0.0 standard drinks    Comment: Quit in 2009; now drinks ~2 cans beer per week  . Drug use: No  . Sexual activity: Not on file  Lifestyle  . Physical activity    Days per week: Not on file    Minutes per session: Not on file  . Stress: Not on file  Relationships  . Social Herbalist on phone: Not on file    Gets together: Not on file    Attends religious service: Not on file    Active member of club or organization: Not on file    Attends meetings of clubs or organizations: Not on file    Relationship status: Not on file  Other Topics Concern  . Not on file  Social History Narrative   Financial assistance approved for 100% discount at [facility] and has [facility] card per [facility] October 3,2011 1:56PM  Dropped out of 11th grade in high school.   Used to work in [employer].   No family in [place] (2 sisters and a brother in [place])   Family History  Problem Relation Age of Onset  . Heart disease Mother 88       Myocardial infarction  . Diabetes Mother   . Hypertension Mother   . Cancer Father    O:    Component Value Date/Time   CHOL 202 (H) 11/01/2014 1450   HDL 60 11/01/2014 1450   LDLCALC 123 (H) 11/01/2014 1450   TRIG 96 11/01/2014 1450   GLUCOSE 299 (H) 02/17/2018 1401   GLUCOSE 78 06/28/2014 1457   HGBA1C 13.4 (A) 10/18/2018 1351   HGBA1C >14.0 (A) 02/17/2018 1343   NA 136 02/17/2018 1401   K 4.6 02/17/2018 1401   CL 99 02/17/2018 1401   CO2 20 02/17/2018 1401   BUN 14 02/17/2018 1401   CREATININE 0.97 02/17/2018 1401   CREATININE 0.94 06/28/2014 1457   CALCIUM 9.1 02/17/2018 1401   GFRNONAA 89 02/17/2018 1401    GFRNONAA >89 06/28/2014 1457   GFRAA 103 02/17/2018 1401   GFRAA >89 06/28/2014 1457   AST 14 06/28/2014 1457   ALT 13 06/28/2014 1457   WBC 6.4 02/17/2018 1401   WBC 6.1 06/28/2014 1457   HGB 14.2 02/17/2018 1401   HCT 42.2 02/17/2018 1401   PLT 187 02/17/2018 1401   TSH 0.570 06/28/2014 1457   Ht Readings from Last 2 Encounters:  12/21/18 '6\' 1"'$  (1.854 m)  10/18/18 '5\' 11"'$  (1.803 m)   Wt Readings from Last 2 Encounters:  12/21/18 204 lb (92.5 kg)  10/18/18 196 lb 1.6 oz (89 kg)   There is no height or weight on file to calculate BMI. BP Readings from Last 3 Encounters:  10/18/18 139/81  08/09/18 140/80  02/17/18 140/86    A/P: Patient reports needing help with medication affordability and simplification. He did not bring medications to clinic, but states he is taking a long-acting insulin 30 units daily, semaglutide (Ozempic 0.25 mg weekly), canagliflozin 100 mg daily, and metformin 500 mg BID. Patient did not bring home BG meter to clinic, but states his average is around 200s, which has improved from 300s. No signs/symptoms of concern reported today.  Patient was switched to a regimen of empagliflozin-metformin (5 mg-1000 mg) twice daily and insulin degludec-liraglutide 25 units daily (samples provided). This is therapeutically equivalent to previous regimen, with exception of increased metformin dose.  An after visit summary was provided and patient advised to follow up in 1 week or sooner if any changes in condition or questions regarding medications arise.   The patient verbalized understanding of information provided by repeating back concepts discussed.

## 2019-02-06 ENCOUNTER — Telehealth: Payer: Self-pay

## 2019-02-06 NOTE — Telephone Encounter (Signed)
Called BI Cares and Novo PAP to follow up on [place] applications, respectively. BI application approved through 02/06/2020 and shipment is scheduled for 02/07/2019. Novo application approved through 02/01/2020. Novo shipment should arrive at Outpatient pharmacy in 10-14 business days. [name].   Terry Castillo, Student-PharmD 02/06/2019 11:37 AM

## 2019-02-12 ENCOUNTER — Telehealth: Payer: Self-pay

## 2019-02-13 NOTE — Telephone Encounter (Signed)
Returning patient's call regarding Xultophy and synjardy. Patient reports severe GI upset (vomiting) and stopped taking both medications on 02/07/19 because he did not know what was causing his symptoms. Patient requested pharmacy appointment ASAP. Available 02/16/19 at 11am, please schedule.   Brenton Grills, Student-PharmD 02/13/2019 3:58 PM

## 2019-02-16 ENCOUNTER — Ambulatory Visit: Payer: Self-pay | Admitting: Pharmacist

## 2019-02-16 DIAGNOSIS — Z794 Long term (current) use of insulin: Secondary | ICD-10-CM

## 2019-02-16 DIAGNOSIS — E119 Type 2 diabetes mellitus without complications: Secondary | ICD-10-CM

## 2019-02-16 MED ORDER — CONTOUR NEXT TEST VI STRP: ORAL_STRIP | 5 refills | Status: DC

## 2019-02-16 MED FILL — CONTOUR NEXT STRIPS: 30 days supply | Qty: 100 | Fill #0

## 2019-02-16 NOTE — Progress Notes (Signed)
S: Terry Castillo is a 53 y.o. male reports to clinical pharmacist appointment for help with DM management. Patient did  bring medication bottles.  No Known Allergies  Current Outpatient Medications:  .  atorvastatin (LIPITOR) 40 MG tablet, Take 1 tablet (40 mg total) by mouth daily., Disp: 90 tablet, Rfl: 3 .  Blood Glucose Monitoring Suppl (WAVESENSE AMP) W/DEVICE KIT, Use to test blood glucose 3 times per day or as needed., Disp: 1 kit, Rfl: 0 .  Empagliflozin-metFORMIN HCl ER (SYNJARDY XR) 09-998 MG TB24, Take 1 tablet by mouth 2 (two) times daily with a meal., Disp: 90 tablet, Rfl: 3 .  glucose blood (CONTOUR NEXT TEST) test strip, Use to test blood sugar 2-3 times per day, Disp: 100 each, Rfl: 5 .  Insulin Degludec-Liraglutide (XULTOPHY) 100-3.6 UNIT-MG/ML SOPN, Inject 50 Units into the skin daily., Disp: 20 pen, Rfl: 2 .  Insulin Pen Needle 32G X 4 MM MISC, 1 application by Does not apply route daily., Disp: 100 each, Rfl: 3 .  Insulin Syringe-Needle U-100 (SM INSULIN SYRINGE) 31G X 5/16" 0.5 ML MISC, Inject 1 Syringe into the skin 2 (two) times daily. Use with Novolog 7030 as instructed 2(twice) daiy, Disp: 100 each, Rfl: 11 .  Lancets MISC, Use to test blood sugar 2-3 times per day, Disp: 100 each, Rfl: 5 .  rivaroxaban (XARELTO) 20 MG TABS tablet, Take 1 tablet (20 mg total) by mouth daily with supper., Disp: 90 tablet, Rfl: 3 Past Medical History:  Diagnosis Date  . Alcohol abuse   . Atrial fibrillation with RVR ([place]) 2006   spontaneous conversion to sinus rhythm  . Diabetes mellitus    uncontrolled  . History of tobacco use   . Onychomycosis    Social History   Socioeconomic History  . Marital status: Legally Separated    Spouse name: Not on file  . Number of children: Not on file  . Years of education: 21  . Highest education level: Not on file  Occupational History  . Not on file  Social Needs  . Financial resource strain: Not on file  . Food insecurity    Worry:  Not on file    Inability: Not on file  . Transportation needs    Medical: Not on file    Non-medical: Not on file  Tobacco Use  . Smoking status: Former Smoker    Packs/day: 0.25    Types: Cigarettes    Quit date: 04/16/2017    Years since quitting: 1.8  . Smokeless tobacco: Never Used  Substance and Sexual Activity  . Alcohol use: No    Alcohol/week: 0.0 standard drinks    Comment: Quit in 2009; now drinks ~2 cans beer per week  . Drug use: No  . Sexual activity: Not on file  Lifestyle  . Physical activity    Days per week: Not on file    Minutes per session: Not on file  . Stress: Not on file  Relationships  . Social Herbalist on phone: Not on file    Gets together: Not on file    Attends religious service: Not on file    Active member of club or organization: Not on file    Attends meetings of clubs or organizations: Not on file    Relationship status: Not on file  Other Topics Concern  . Not on file  Social History Narrative   Financial assistance approved for 100% discount at [facility] and has [facility]  card per [facility] October 3,2011 1:56PM      Dropped out of 11th grade in high school.   Used to work in [employer].   No family in [place] (2 sisters and a brother in [place])   Family History  Problem Relation Age of Onset  . Heart disease Mother 65       Myocardial infarction  . Diabetes Mother   . Hypertension Mother   . Cancer Father    O:    Component Value Date/Time   CHOL 202 (H) 11/01/2014 1450   HDL 60 11/01/2014 1450   LDLCALC 123 (H) 11/01/2014 1450   TRIG 96 11/01/2014 1450   GLUCOSE 299 (H) 02/17/2018 1401   GLUCOSE 78 06/28/2014 1457   HGBA1C 13.4 (A) 10/18/2018 1351   HGBA1C >14.0 (A) 02/17/2018 1343   NA 136 02/17/2018 1401   K 4.6 02/17/2018 1401   CL 99 02/17/2018 1401   CO2 20 02/17/2018 1401   BUN 14 02/17/2018 1401   CREATININE 0.97 02/17/2018 1401   CREATININE 0.94 06/28/2014 1457   CALCIUM 9.1  02/17/2018 1401   GFRNONAA 89 02/17/2018 1401   GFRNONAA >89 06/28/2014 1457   GFRAA 103 02/17/2018 1401   GFRAA >89 06/28/2014 1457   AST 14 06/28/2014 1457   ALT 13 06/28/2014 1457   WBC 6.4 02/17/2018 1401   WBC 6.1 06/28/2014 1457   HGB 14.2 02/17/2018 1401   HCT 42.2 02/17/2018 1401   PLT 187 02/17/2018 1401   TSH 0.570 06/28/2014 1457   Ht Readings from Last 2 Encounters:  12/21/18 '6\' 1"'$  (1.854 m)  10/18/18 '5\' 11"'$  (1.803 m)   Wt Readings from Last 2 Encounters:  12/21/18 204 lb (92.5 kg)  10/18/18 196 lb 1.6 oz (89 kg)   There is no height or weight on file to calculate BMI. BP Readings from Last 3 Encounters:  10/18/18 139/81  08/09/18 140/80  02/17/18 140/86   A/P: Patient is currently taking Xultophy (insulin degludec/liraglutide) 25 units daily. Although we initiated Synjardy (empagliflozin/metformin) '5mg'$ /1000 mg BID, patient states he was unable to tolerate the Synjardy due to vomiting after taking a dose.   Home BG 160s, patient reports no sign/symptoms of concern, no hyper- hypoglycemic episodes. Advised patient to increase Xultophy to 40 units. We will try reducing metformin dose to 500 mg BID for tolerability, continue empagliflozin 10 mg daily.  An after visit summary was provided and patient advised to follow up in 1 week or sooner if any changes in condition or questions regarding medications arise.   The patient verbalized understanding of information provided by repeating back concepts discussed.

## 2019-02-22 ENCOUNTER — Telehealth: Payer: Self-pay

## 2019-02-22 NOTE — Telephone Encounter (Signed)
LVM to discuss tolerability of therapy changes.  Brenton Grills, Student-PharmD 02/22/2019 10:03 AM

## 2019-02-26 NOTE — Telephone Encounter (Signed)
LVM  Brenton Grills, Student-PharmD 02/26/2019 9:33 AM

## 2019-02-26 NOTE — Telephone Encounter (Signed)
Diabetes Management Follow Up CHARVEZ VOORHIES is a 53 y.o. male who was contacted for DM management. Identity was verified using date of birth and address.  Diabetes medications Xultophy 40 units daily, metformin 500 mg BID, empagliflozin 10 mg daily. Patient correctly reports DM regimen and adherence  Home BG average 190 mg/dL ranging from 180-200 (correlates with A1C of around 7.8%) Denies hypoglycemia symptoms Denies hyperglycemia symptoms   A/P Blood glucose control: poor  Reviewed appropriate home blood glucose monitoring  Patient reports that he has been feeling much better since decreasing daily metformin dose. Denies further nausea or vomiting.   Recommend assessing empagliflozin tolerability in ~3 weeks to consider increasing dose to 25 mg daily to improve BG control.   Advised to contact clinic or seek medical attention if concerns or symptoms arise.  The patient verbalized understanding of information provided by repeating back concepts discussed.  Follow-up No follow-ups on file.  Brenton Grills

## 2019-03-16 ENCOUNTER — Telehealth: Payer: Self-pay | Admitting: Pharmacist

## 2019-03-16 DIAGNOSIS — E119 Type 2 diabetes mellitus without complications: Secondary | ICD-10-CM

## 2019-03-16 DIAGNOSIS — Z794 Long term (current) use of insulin: Secondary | ICD-10-CM

## 2019-03-16 MED ORDER — XULTOPHY 100-3.6 UNIT-MG/ML ~~LOC~~ SOPN: 30.0000 [IU] | PEN_INJECTOR | Freq: Every day | SUBCUTANEOUS | 3 refills | Status: DC

## 2019-03-16 MED ORDER — SYNJARDY XR 10-1000 MG PO TB24: 1.0000 | ORAL_TABLET | Freq: Every day | ORAL | 3 refills | Status: DC

## 2019-03-16 NOTE — Telephone Encounter (Signed)
Diabetes Management Follow Up Terry Castillo is a 53 y.o. male who was contacted for DM management. Identity was verified using date of birth and address.  Diabetes medications: Xultophy, patient reports taking 25 units daily, empagliflozin 10 mg daily, metformin 1000 mg daily  Home BG average 180 mg/dL (correlates with A1C of around 7.9%) Patient reports no signs or symptoms of hypo- or hyperglycemia   A/P Blood glucose control: improved, yet still not at goal. At our last follow up, patient reported GI upset, so we decreased metformin from 2000 mg to 1000 mg daily. Today, he states the GI upset has subsided. Current regimen includes Synjardy XR 10mg /1000 mg once daily.   Patient was advised to increase Xultophy to 30 units daily and he verbalized understanding.  Advised to contact clinic or seek medical attention if concerns or symptoms arise. Will notify PCP of findings.  The patient verbalized understanding of information provided by repeating back concepts discussed.  Follow-up 2 weeks  Flossie Dibble

## 2019-04-06 ENCOUNTER — Telehealth: Payer: Self-pay | Admitting: Pharmacist

## 2019-04-06 NOTE — Progress Notes (Signed)
Diabetes Management Follow Up Terry Castillo is a 53 y.o. male who was contacted for DM management follow up.  Diabetes medications: insulin degludec/liraglutide (Xultophy) 30 units daily, empagliflozin 10 mg/1000 mg 1 tablet daily; patient correctly reports DM regimen and adherence  Home BG average 150 mg/dL (correlates with A1C of around 6.9%) No signs or symptoms of hypo- or hyperglycemia reported   A/P Blood glucose control: improved and stable, no further changes needed at this time  Advised to contact clinic or seek medical attention if concerns or symptoms arise. Will notify PCP of findings.  The patient verbalized understanding of information provided by repeating back concepts discussed.  Follow-up Will request front desk contact patient to schedule PCP visit  Flossie Dibble

## 2019-04-12 NOTE — Telephone Encounter (Signed)
Thank you so much for your help with [name]. I really appreciate it!

## 2019-05-30 ENCOUNTER — Other Ambulatory Visit: Payer: Self-pay | Admitting: Pharmacist

## 2019-05-30 DIAGNOSIS — E119 Type 2 diabetes mellitus without complications: Secondary | ICD-10-CM

## 2019-05-30 MED ORDER — XULTOPHY 100-3.6 UNIT-MG/ML ~~LOC~~ SOPN: 30.0000 [IU] | PEN_INJECTOR | Freq: Every day | SUBCUTANEOUS | 3 refills | Status: DC

## 2019-05-30 MED ORDER — INSULIN PEN NEEDLE 32G X 4 MM MISC: 1.0000 "application " | Freq: Every day | 3 refills | Status: DC

## 2019-05-30 NOTE — Progress Notes (Signed)
Prescription transferred to [facility] for access to [facility] Patient Assistance Program (patient approved and enrolled by [employer] for free Xultophy and pen needles).

## 2019-06-08 MED FILL — NOVOFINE PLUS PEN NDL 32GX1: 32G X 4 MM | 200 days supply | Qty: 200 | Fill #0

## 2019-06-20 ENCOUNTER — Ambulatory Visit (INDEPENDENT_AMBULATORY_CARE_PROVIDER_SITE_OTHER): Payer: Self-pay | Admitting: Internal Medicine

## 2019-06-20 ENCOUNTER — Encounter: Payer: Self-pay | Admitting: Internal Medicine

## 2019-06-20 ENCOUNTER — Other Ambulatory Visit: Payer: Self-pay

## 2019-06-20 VITALS — BP 125/82 | HR 98 | Temp 98.4°F | Ht 73.0 in | Wt 193.7 lb

## 2019-06-20 DIAGNOSIS — Z79899 Other long term (current) drug therapy: Secondary | ICD-10-CM

## 2019-06-20 DIAGNOSIS — Z1211 Encounter for screening for malignant neoplasm of colon: Secondary | ICD-10-CM

## 2019-06-20 DIAGNOSIS — Z794 Long term (current) use of insulin: Secondary | ICD-10-CM

## 2019-06-20 DIAGNOSIS — I480 Paroxysmal atrial fibrillation: Secondary | ICD-10-CM

## 2019-06-20 DIAGNOSIS — E119 Type 2 diabetes mellitus without complications: Secondary | ICD-10-CM

## 2019-06-20 DIAGNOSIS — E782 Mixed hyperlipidemia: Secondary | ICD-10-CM

## 2019-06-20 DIAGNOSIS — Z7901 Long term (current) use of anticoagulants: Secondary | ICD-10-CM

## 2019-06-20 DIAGNOSIS — E785 Hyperlipidemia, unspecified: Secondary | ICD-10-CM

## 2019-06-20 LAB — GLUCOSE, CAPILLARY: Glucose-Capillary: 165 mg/dL — ABNORMAL HIGH (ref 70–99)

## 2019-06-20 LAB — POCT GLYCOSYLATED HEMOGLOBIN (HGB A1C): Hemoglobin A1C: 8.5 % — AB (ref 4.0–5.6)

## 2019-06-20 NOTE — Patient Instructions (Signed)
It was nice seeing you today! Thank you for choosing [facility] for your Primary Care.    Today we talked about:   1. Diabetes: Your A1c is a number we use to track your diabetes. It has improved so much since your last visit. Keep up the great work. No medication changes were made today.  2. Colonoscopy: You will receive a call to schedule this

## 2019-06-20 NOTE — Progress Notes (Signed)
   CC: Diabetes follow up  HPI:  Terry Castillo is a 54 y.o. with a PMHx of paroxysmal A. Fib, T2DM who presents to the clinic for diabetes follow up.   Please see the Encounters tab for problem-based Assessment & Plan regarding status of patient's chronic conditions.  Past Medical History:  Diagnosis Date  . Alcohol abuse   . Atrial fibrillation with RVR ([facility]) 2006   spontaneous conversion to sinus rhythm  . Diabetes mellitus    uncontrolled  . History of tobacco use   . Onychomycosis    Review of Systems: Review of Systems  Constitutional: Negative for chills and fever.  Respiratory: Negative for cough and shortness of breath.   Cardiovascular: Negative for chest pain and palpitations.  Gastrointestinal: Negative for abdominal pain, nausea and vomiting.  Neurological: Negative for dizziness, weakness and headaches.   Physical Exam:  Vitals:   06/20/19 1323  BP: 125/82  Pulse: 98  Temp: 98.4 F (36.9 C)  TempSrc: Oral  SpO2: 99%  Weight: 193 lb 11.2 oz (87.9 kg)  Height: 6\' 1"  (1.854 m)   Physical Exam Vitals and nursing note reviewed.  Constitutional:      General: He is not in acute distress.    Appearance: He is normal weight.  Cardiovascular:     Rate and Rhythm: Normal rate and regular rhythm.     Heart sounds: No murmur.  Pulmonary:     Effort: Pulmonary effort is normal. No respiratory distress.     Breath sounds: Normal breath sounds.  Skin:    General: Skin is warm and dry.  Neurological:     Mental Status: He is alert and oriented to person, place, and time.     Motor: No weakness.     Gait: Gait normal.  Psychiatric:        Mood and Affect: Mood normal.        Behavior: Behavior normal.    Assessment & Plan:   See Encounters Tab for problem based charting.  Patient discussed with Dr. 

## 2019-06-21 LAB — BMP8+ANION GAP
Anion Gap: 13 mmol/L (ref 10.0–18.0)
BUN/Creatinine Ratio: 14 (ref 9–20)
BUN: 15 mg/dL (ref 6–24)
CO2: 20 mmol/L (ref 20–29)
Calcium: 9.3 mg/dL (ref 8.7–10.2)
Chloride: 101 mmol/L (ref 96–106)
Creatinine, Ser: 1.05 mg/dL (ref 0.76–1.27)
GFR calc Af Amer: 92 mL/min/{1.73_m2} (ref >59)
GFR calc non Af Amer: 80 mL/min/{1.73_m2} (ref >59)
Glucose: 154 mg/dL — ABNORMAL HIGH (ref 65–99)
Potassium: 4.7 mmol/L (ref 3.5–5.2)
Sodium: 134 mmol/L (ref 134–144)

## 2019-06-21 LAB — MICROALBUMIN / CREATININE URINE RATIO
Creatinine, Urine: 59.4 mg/dL
Microalb/Creat Ratio: <5 mg/g creat (ref 0–29)
Microalbumin, Urine: <3 ug/mL

## 2019-06-21 LAB — LIPID PANEL
Chol/HDL Ratio: 3.2 ratio (ref 0.0–5.0)
Cholesterol, Total: 171 mg/dL (ref 100–199)
HDL: 54 mg/dL (ref >39)
LDL Chol Calc (NIH): 99 mg/dL (ref 0–99)
Triglycerides: 100 mg/dL (ref 0–149)
VLDL Cholesterol Cal: 18 mg/dL (ref 5–40)

## 2019-06-22 NOTE — Assessment & Plan Note (Signed)
Patient reports he is taking Lipitor intermittently. He is unsure though, as he does not recognize the name of all his medications. I encouraged that he bring in the bottles to his next appt.   Checking lipid panel at this time to determine if dosage change is needed. Patient requested we review labs at next visit.   Plan:  - Continue Atorvastatin 40 mg  - Lipid panel pending

## 2019-06-22 NOTE — Assessment & Plan Note (Addendum)
A1c today: 8.5%  Home medications: Xultophy 35 units daily, Synjardy 02-999 mg daily   Terry Castillo reports his blood sugars have been better controlled since started his new medications. Previously referred patient to Dr. Maudie Mercury to consolidate and optimize DM meds.   He brought in his glucometer and averages are around 130. His maximum is 212 and minimum of 86. Patient denies any symptoms of dizziness, jitteriness, diaphoresis, LOC with his low of 86, or at any other times.   Encouraged to continue excellent work in reducing a1c and controlling his diabetes. We also further discussed dietary changes, including reducing mac and cheese, potatoes.   Plan:  - Continue current medication regimen - Continue checking BG daily  - Checking creatinine, urine microalbumin - Follow up in 6 months

## 2019-06-22 NOTE — Assessment & Plan Note (Signed)
Patient denies any CP or palpitations. He endorses taking his Xarelto daily still.   On examination, patient is RRR.   Plan:  - Continue Xarelto

## 2019-06-26 NOTE — Progress Notes (Signed)
Internal Medicine Clinic Attending  Case discussed with Dr. Basaraba at the time of the visit.  We reviewed the resident's history and exam and pertinent patient test results.  I agree with the assessment, diagnosis, and plan of care documented in the resident's note.    

## 2019-07-25 ENCOUNTER — Telehealth: Payer: Self-pay | Admitting: *Deleted

## 2019-07-25 ENCOUNTER — Ambulatory Visit: Payer: Self-pay

## 2019-07-25 ENCOUNTER — Other Ambulatory Visit: Payer: Self-pay | Admitting: *Deleted

## 2019-07-25 DIAGNOSIS — Z794 Long term (current) use of insulin: Secondary | ICD-10-CM

## 2019-07-25 DIAGNOSIS — I480 Paroxysmal atrial fibrillation: Secondary | ICD-10-CM

## 2019-07-25 DIAGNOSIS — E119 Type 2 diabetes mellitus without complications: Secondary | ICD-10-CM

## 2019-07-25 DIAGNOSIS — E785 Hyperlipidemia, unspecified: Secondary | ICD-10-CM

## 2019-07-25 MED ORDER — ATORVASTATIN CALCIUM 40 MG PO TABS: 40.0000 mg | ORAL_TABLET | Freq: Every day | ORAL | 1 refills | Status: DC

## 2019-07-25 MED ORDER — RIVAROXABAN 20 MG PO TABS: 20.0000 mg | ORAL_TABLET | Freq: Every day | ORAL | 1 refills | Status: DC

## 2019-07-25 MED ORDER — SYNJARDY XR 10-1000 MG PO TB24: 1.0000 | ORAL_TABLET | Freq: Every day | ORAL | 1 refills | Status: DC

## 2019-07-25 MED FILL — XARELTO 20 MG TABLET: 20 | 30 days supply | Qty: 30 | Fill #0

## 2019-07-25 MED FILL — XULTOPHY 100 UNIT-3.6MG/ML: 100-3.6 | 30 days supply | Qty: 9 | Fill #0

## 2019-07-25 MED FILL — ATORVASTATIN 40 MG TABLET: 40 | 30 days supply | Qty: 30 | Fill #0

## 2019-07-25 MED FILL — SYNJARDY XR 10-1000 MG TB24: 10-1000 | 30 days supply | Qty: 30 | Fill #0

## 2019-07-25 NOTE — Telephone Encounter (Signed)
Thank you! Please let me know if there are any orders I need to place for Terry Castillo.

## 2019-07-25 NOTE — Telephone Encounter (Signed)
WALK IN Pt presented with paperwork for re application to [place] med assist Also needing refills but doesn't know what to do Homeless also  Called op pharm- he is on pt assist so his xultophy is at pharm His other meds will do IM PROGRAM til he is recertified  for med assist of [place] Gave him ph# to [place] [employer] [facility] Ask him to call for needs, he was agreeable

## 2019-08-16 NOTE — Addendum Note (Signed)
Addended by: Neomia Dear on: 08/16/2019 05:23 PM   Modules accepted: Orders

## 2019-10-02 ENCOUNTER — Other Ambulatory Visit: Payer: Self-pay | Admitting: *Deleted

## 2019-10-02 ENCOUNTER — Telehealth: Payer: Self-pay

## 2019-10-02 ENCOUNTER — Telehealth: Payer: Self-pay | Admitting: *Deleted

## 2019-10-02 DIAGNOSIS — Z794 Long term (current) use of insulin: Secondary | ICD-10-CM

## 2019-10-02 DIAGNOSIS — E119 Type 2 diabetes mellitus without complications: Secondary | ICD-10-CM

## 2019-10-02 MED ORDER — XULTOPHY 100-3.6 UNIT-MG/ML ~~LOC~~ SOPN: 30.0000 [IU] | PEN_INJECTOR | Freq: Every day | SUBCUTANEOUS | 3 refills | Status: DC

## 2019-10-02 NOTE — Telephone Encounter (Signed)
Pt states he used the last of his insulin this am, states he doesn't know what happen with the [employer] pt assist, Tresa Endo could you call me at [phone] and we can figure this out. Thanks Pt ph# [phone]

## 2019-10-02 NOTE — Telephone Encounter (Signed)
Left a voicemail at [phone], 3:31pm-patient has medication at no charge at the [facility].  Spoke to [place] at the Pharmacy and she stated that pt just has to go pick up the meds there.  She stated that they were under the impression that the pt was aware, that they were storing the medication for him because he indicated that he did not have a way to keep the medication cold.  Instructed pt to pick up medications at [facility] on the voicemail left and also left my number, [phone] for questions.

## 2019-10-02 NOTE — Telephone Encounter (Signed)
[name] from [facility] returned call. States patient has Rx for Xultophy at [facility] at no charge. Tresa Endo has left this info on patient's VM. Kinnie Feil, BSN, RN-BC

## 2019-10-03 MED FILL — XULTOPHY 100 UNIT-3.6MG/ML: 100-3.6 | 119 days supply | Qty: 36 | Fill #1

## 2019-10-24 NOTE — Telephone Encounter (Signed)
CREATED IN ERROR

## 2020-03-20 ENCOUNTER — Other Ambulatory Visit: Payer: Self-pay

## 2020-03-20 ENCOUNTER — Encounter: Payer: Self-pay | Admitting: Internal Medicine

## 2020-03-20 ENCOUNTER — Ambulatory Visit (INDEPENDENT_AMBULATORY_CARE_PROVIDER_SITE_OTHER): Payer: Self-pay | Admitting: Internal Medicine

## 2020-03-20 VITALS — BP 113/65 | HR 82 | Temp 98.0°F | Ht 73.0 in | Wt 181.4 lb

## 2020-03-20 DIAGNOSIS — E119 Type 2 diabetes mellitus without complications: Secondary | ICD-10-CM

## 2020-03-20 DIAGNOSIS — Z794 Long term (current) use of insulin: Secondary | ICD-10-CM

## 2020-03-20 LAB — POCT URINALYSIS DIPSTICK
Bilirubin, UA: NEGATIVE
Blood, UA: NEGATIVE
Glucose, UA: POSITIVE — AB
Ketones, UA: NEGATIVE
Leukocytes, UA: NEGATIVE
Nitrite, UA: NEGATIVE
Protein, UA: NEGATIVE
Spec Grav, UA: 1.015 (ref 1.010–1.025)
Urobilinogen, UA: 0.2 E.U./dL
pH, UA: 5.5 (ref 5.0–8.0)

## 2020-03-20 LAB — GLUCOSE, CAPILLARY: Glucose-Capillary: 278 mg/dL — ABNORMAL HIGH (ref 70–99)

## 2020-03-20 LAB — POCT GLYCOSYLATED HEMOGLOBIN (HGB A1C): Hemoglobin A1C: 10.9 % — AB (ref 4.0–5.6)

## 2020-03-20 MED ORDER — XULTOPHY 100-3.6 UNIT-MG/ML ~~LOC~~ SOPN: 30.0000 [IU] | PEN_INJECTOR | Freq: Every day | SUBCUTANEOUS | 3 refills | Status: DC

## 2020-03-20 NOTE — Progress Notes (Signed)
   CC: diabetes f/u  HPI:  Terry Castillo is a 54 y.o. male with PMHx as listed below presenting for diabetes follow up. Patient endorses he has been out of insulin for about 90 days. Continuing to take synjardy. Has been checking his CBGs at home and note that they range from 200-400 with average in 330-340. Denies decreased energy levels compared to when he was taking the insulin.  Please see problem based charting for complete assessment and plan.  Past Medical History:  Diagnosis Date  . Alcohol abuse   . Atrial fibrillation with RVR ([facility]) 2006   spontaneous conversion to sinus rhythm  . Diabetes mellitus    uncontrolled  . History of tobacco use   . Onychomycosis    Review of Systems:  Negative except as stated in HPI.  Physical Exam:  Vitals:   03/20/20 1444  BP: 113/65  Pulse: 82  Temp: 98 F (36.7 C)  TempSrc: Oral  SpO2: 97%  Weight: 181 lb 6.4 oz (82.3 kg)  Height: 6\' 1"  (1.854 m)   Physical Exam  Constitutional: Appears well-developed and well-nourished. No distress.  HENT: Normocephalic and atraumatic, EOMI, moist mucous membranes Cardiovascular: Normal rate, regular rhythm, S1 and S2 present, no murmurs, rubs, gallops.  Distal pulses intact Respiratory: No respiratory distress, no accessory muscle use.  Effort is normal.  Lungs are clear to auscultation bilaterally. GI: Nondistended, soft, nontender to palpation, normal active bowel sounds Musculoskeletal: Normal bulk and tone.  No peripheral edema noted. Skin: Warm and dry.  No rash, erythema, lesions noted. Psychiatric: Normal mood and affect. Behavior is normal. Judgment and thought content normal.    Assessment & Plan:   See Encounters Tab for problem based charting.  Patient discussed with Dr. 

## 2020-03-20 NOTE — Patient Instructions (Addendum)
Terry Castillo  It was a pleasure seeing you in clinic. Today we discussed:   Diabetes: Your A1c is 10.9 at this visit.   I have sent a prescription for Xultophy 30U daily to the [facility]. Please take this daily and continue to take your Barry daily. Continue checking your blood sugars at home. Follow up in 4 weeks (or sooner if blood sugars remain high).   If you have any questions or concerns, please call our clinic at [phone] between 9am-5pm and after hours call [phone] and ask for the internal medicine resident on call. If you feel you are having a medical emergency please call 911.   Thank you, we look forward to helping you remain healthy!

## 2020-03-21 NOTE — Assessment & Plan Note (Addendum)
Patient is presenting for refill on his Xultophy. He states he has been out of insulin for about 90 days but has continued taking his Synjardy. Has been checking his CBGs at home and note that they range from 200-400 with average in 330-340. Denies decreased energy levels compared to when he was taking the insulin. He denies polyuria or polydipsia. HbA1c is 10.9 at this visit. Urine dipstick with glucosuria but negative for ketonuria. Low likelihood of patient being in DKA at this time. On medication review, patient had 3 refills on Xultophy. Encouraged patient to call pharmacy when he runs out of his medication for refills. He expresses understanding.   Plan: Continue Xultophy 35U daily  Continue Synjardy 10-1000mg  daily Continue home glucose monitoring  Follow up in 4 weeks for CBG check and adjustment of insulin regimen

## 2020-03-24 NOTE — Progress Notes (Signed)
Internal Medicine Clinic Attending ° °Case discussed with Dr. Aslam  At the time of the visit.  We reviewed the resident’s history and exam and pertinent patient test results.  I agree with the assessment, diagnosis, and plan of care documented in the resident’s note.  °

## 2020-04-01 ENCOUNTER — Other Ambulatory Visit: Payer: Self-pay | Admitting: Student

## 2020-04-01 ENCOUNTER — Encounter: Payer: Self-pay | Admitting: Student

## 2020-04-01 ENCOUNTER — Other Ambulatory Visit: Payer: Self-pay

## 2020-04-01 ENCOUNTER — Ambulatory Visit: Payer: Self-pay | Admitting: Student

## 2020-04-01 DIAGNOSIS — E782 Mixed hyperlipidemia: Secondary | ICD-10-CM

## 2020-04-01 DIAGNOSIS — E785 Hyperlipidemia, unspecified: Secondary | ICD-10-CM

## 2020-04-01 DIAGNOSIS — I480 Paroxysmal atrial fibrillation: Secondary | ICD-10-CM

## 2020-04-01 DIAGNOSIS — Z794 Long term (current) use of insulin: Secondary | ICD-10-CM

## 2020-04-01 DIAGNOSIS — E119 Type 2 diabetes mellitus without complications: Secondary | ICD-10-CM

## 2020-04-01 DIAGNOSIS — I10 Essential (primary) hypertension: Secondary | ICD-10-CM

## 2020-04-01 MED ORDER — RIVAROXABAN 20 MG PO TABS: 20.0000 mg | ORAL_TABLET | Freq: Every day | ORAL | 11 refills | Status: DC

## 2020-04-01 MED ORDER — INSULIN DETEMIR 100 UNIT/ML FLEXPEN: 30.0000 [IU] | PEN_INJECTOR | Freq: Every day | SUBCUTANEOUS | 2 refills | Status: DC

## 2020-04-01 MED ORDER — LIRAGLUTIDE 18 MG/3ML ~~LOC~~ SOPN: 1.8000 mg | PEN_INJECTOR | Freq: Every morning | SUBCUTANEOUS | 1 refills | Status: DC

## 2020-04-01 MED ORDER — SYNJARDY XR 10-1000 MG PO TB24: 1.0000 | ORAL_TABLET | Freq: Every day | ORAL | 11 refills | Status: DC

## 2020-04-01 MED ORDER — ATORVASTATIN CALCIUM 40 MG PO TABS: 40.0000 mg | ORAL_TABLET | Freq: Every day | ORAL | 11 refills | Status: DC

## 2020-04-01 MED ORDER — XULTOPHY 100-3.6 UNIT-MG/ML ~~LOC~~ SOPN: 30.0000 [IU] | PEN_INJECTOR | Freq: Every day | SUBCUTANEOUS | 11 refills | Status: DC

## 2020-04-01 MED FILL — UNIFINE PENTIPS 32GX5/32: 32G X 4 MM | 30 days supply | Qty: 100 | Fill #0

## 2020-04-01 MED FILL — VICTOZA 18 MG/3 ML INJECT P: 18 | 30 days supply | Qty: 9 | Fill #0

## 2020-04-01 MED FILL — SYNJARDY XR 10-1000 MG TB24: 10-1000 | 30 days supply | Qty: 30 | Fill #1

## 2020-04-01 MED FILL — ATORVASTATIN 40 MG TABLET: 40 | 30 days supply | Qty: 30 | Fill #1

## 2020-04-01 MED FILL — XARELTO 20 MG TABLET: 20 | 30 days supply | Qty: 30 | Fill #1

## 2020-04-01 MED FILL — LEVEMIR FLEXTOUCH 100 UNITS: 100 | 30 days supply | Qty: 9 | Fill #0

## 2020-04-01 NOTE — Patient Instructions (Addendum)
Terry Castillo,  It was a pleasure seeing you in the clinic today.   As we discussed, it is very important for you to take your diabetes medications regularly.   I have sent refills to the [facility] for your lipitor, xarelto, and synjardy. As we discussed, your patient assistance program has expired so we will try to have it renewed to get your xultophy. For now, I will be sending prescriptions for Levemir 30 units and Victoza 1.8 units, which is equivalent to your xultophy. Please pick these medications up from the pharmacy.  Lastly, I have sent a referral to an eye doctor.  Please call our clinic at [phone] if you have any questions or concerns. The best time to call is Monday-Friday from 9am-4pm, but there is someone available 24/7 at the same number. If you need medication refills, please notify your pharmacy one week in advance and they will send [place] a request.   Thank you for letting us take part in your care. We look forward to seeing you next time!

## 2020-04-01 NOTE — Progress Notes (Signed)
CC: f/u for diabetes  HPI:  [name] is a 54 y.o. male with history of essential HTN (not on any medications), T2DM, paroxysmal atrial fibrillation (on xarelto), hyperlipidemia presenting for follow up of his diabetes. He has been off of his xultophy for >2.5 months due to inability to obtain it from the pharmacy. He does report continuing to take his synjardy. His last HbA1c on 03/20/20 was 10.9. Please see individualized A&P for full HPI.  Past Medical History:  Diagnosis Date  . Alcohol abuse   . Atrial fibrillation with RVR ([place]) 2006   spontaneous conversion to sinus rhythm  . Diabetes mellitus    uncontrolled  . History of tobacco use   . Onychomycosis    Current Outpatient Medications on File Prior to Visit  Medication Sig Dispense Refill  . Blood Glucose Monitoring Suppl (WAVESENSE AMP) W/DEVICE KIT Use to test blood glucose 3 times per day or as needed. 1 kit 0  . glucose blood (CONTOUR NEXT TEST) test strip Use to test blood sugar 2-3 times per day 100 each 5  . Insulin Pen Needle 32G X 4 MM MISC 1 application by Does not apply route daily. 100 each 3  . Insulin Syringe-Needle U-100 (SM INSULIN SYRINGE) 31G X 5/16" 0.5 ML MISC Inject 1 Syringe into the skin 2 (two) times daily. Use with Novolog 7030 as instructed 2(twice) daiy 100 each 11  . Lancets MISC Use to test blood sugar 2-3 times per day 100 each 5   No current facility-administered medications on file prior to visit.   No Known Allergies Family History  Problem Relation Age of Onset  . Heart disease Mother 63       Myocardial infarction  . Diabetes Mother   . Hypertension Mother   . Cancer Father    Social History   Socioeconomic History  . Marital status: Legally Separated    Spouse name: Not on file  . Number of children: Not on file  . Years of education: 58  . Highest education level: Not on file  Occupational History  . Not on file  Tobacco Use  . Smoking status: Current Every Day  Smoker    Packs/day: 0.25    Types: Cigarettes    Last attempt to quit: 04/16/2017    Years since quitting: 2.9  . Smokeless tobacco: Never Used  Substance and Sexual Activity  . Alcohol use: No    Alcohol/week: 0.0 standard drinks    Comment: Quit in 2009; now drinks ~2 cans beer per week  . Drug use: No  . Sexual activity: Not on file  Other Topics Concern  . Not on file  Social History Narrative   Financial assistance approved for 100% discount at [facility] and has [facility] card per Bonna Gains October 3,2011 1:56PM      Dropped out of 11th grade in high school.   Used to work in [employer].   No family in [place] (2 sisters and a brother in [place])   Social Determinants of Health   Financial Resource Strain:   . Difficulty of Paying Living Expenses: Not on file  Food Insecurity:   . Worried About Charity fundraiser in the Last Year: Not on file  . Ran Out of Food in the Last Year: Not on file  Transportation Needs:   . Lack of Transportation (Medical): Not on file  . Lack of Transportation (Non-Medical): Not on file  Physical Activity:   .  Days of Exercise per Week: Not on file  . Minutes of Exercise per Session: Not on file  Stress:   . Feeling of Stress : Not on file  Social Connections:   . Frequency of Communication with Friends and Family: Not on file  . Frequency of Social Gatherings with Friends and Family: Not on file  . Attends Religious Services: Not on file  . Active Member of Clubs or Organizations: Not on file  . Attends Archivist Meetings: Not on file  . Marital Status: Not on file  Intimate Partner Violence:   . Fear of Current or Ex-Partner: Not on file  . Emotionally Abused: Not on file  . Physically Abused: Not on file  . Sexually Abused: Not on file     Review of Systems:   Endorses polyuria, polydipsia, blurry vision.  Otherwise negative ROS.  Physical Exam:  Vitals:   04/01/20 0933  BP: 128/77  Pulse: 93    Temp: 98 F (36.7 C)  TempSrc: Oral  SpO2: 99%  Weight: 178 lb 3.2 oz (80.8 kg)  Height: $Remove'6\' 1"'WfIjWCq$  (1.854 m)   Physical Exam Constitutional:      Appearance: Normal appearance. He is not ill-appearing.  HENT:     Head: Normocephalic and atraumatic.     Mouth/Throat:     Mouth: Mucous membranes are moist.     Pharynx: Oropharynx is clear. No oropharyngeal exudate.  Eyes:     Extraocular Movements: Extraocular movements intact.     Conjunctiva/sclera: Conjunctivae normal.     Pupils: Pupils are equal, round, and reactive to light.  Cardiovascular:     Rate and Rhythm: Normal rate and regular rhythm.     Pulses: Normal pulses.     Heart sounds: Normal heart sounds. No murmur heard.  No friction rub. No gallop.   Pulmonary:     Effort: Pulmonary effort is normal.     Breath sounds: Normal breath sounds. No wheezing, rhonchi or rales.  Abdominal:     General: Bowel sounds are normal. There is no distension.     Palpations: Abdomen is soft.     Tenderness: There is no abdominal tenderness. There is no guarding or rebound.  Musculoskeletal:        General: No swelling. Normal range of motion.     Cervical back: Normal range of motion.  Skin:    General: Skin is warm and dry.     Capillary Refill: Capillary refill takes less than 2 seconds.  Neurological:     General: No focal deficit present.     Mental Status: He is alert and oriented to person, place, and time.  Psychiatric:        Mood and Affect: Mood normal.        Behavior: Behavior normal.      Assessment & Plan:   See Encounters Tab for problem based charting.  Patient seen with Dr. Heber Holyoke

## 2020-04-01 NOTE — Assessment & Plan Note (Signed)
Reports that he has not been taking his xarelto at home for the past month or so. Strongly advised him to restart taking his xarelto daily, to which he was agreeable. -sent refill for xarelto 20mg  daily

## 2020-04-01 NOTE — Assessment & Plan Note (Addendum)
Patient with history of T2DM, on xultophy 30 units daily and synjardy 10-1000mg  daily. He reports that he has been out of his xultophy for the past 2.5 months. He has continued taking his synjardy. Last HbA1c was 10.9. He states that he has not been checking his sugars at home because he knows that they are going to be high without his xultophy, but the last time he checked them they were in the 300s. Reports that he tried calling pharmacy for his xultophy last week, but was told they did not have it in stock. Spoke with pharmacy who said that patient used to be on patient assistance program for xultophy but that has expired so it will need to be renewed.   Patient does report polydipsia and polyuria. Also states that he has been having blurry vision lately, but has not seen an ophthalmologist due to lack of insurance. He is agreeable to seeing an ophthalmologist now, so will place a referral.  I will start patient on levemir 30 units qhs. Also, will start patient on victoza 0.6 mg every morning with incremental increases by 0.6 mg every 2 days until max dose of 1.8 mg every morning. Use this regimen to bridge gap until patient assistance program kicks in and patient can resume his xultophy.  Plan: - renew patient assistance program for xultophy - start levemir 30 units nightly  - start victoza 0.6 units every morning with incremental increases by 0.6 units every week until max dose of 1.8 units every morning - continue synjardy 10-1000mg  daily - continue home glucose monitoring - f/u ophthalmology visit - f/u in 1 month for diabetes

## 2020-04-01 NOTE — Assessment & Plan Note (Signed)
History of essential HTN. He is not on any medications for this. BP 128/77 today. -continue healthy diet and exercise

## 2020-04-01 NOTE — Assessment & Plan Note (Signed)
History of HLD, on lipitor 40mg .  -refilled lipitor 40mg  today

## 2020-04-08 ENCOUNTER — Encounter: Payer: Self-pay | Admitting: Student

## 2020-04-08 NOTE — Progress Notes (Addendum)
Internal Medicine Clinic Attending  I saw and evaluated the patient.  I personally confirmed the key portions of the history and exam documented by Dr. Austin Miles and I reviewed pertinent patient test results.  The assessment, diagnosis, and plan were formulated together and I agree with the documentation in the residents note with the exception that victoza should be titrated up by 0.6mg  in weekly intervals up to a max of 1.8mg  daily.

## 2020-04-13 ENCOUNTER — Ambulatory Visit: Payer: Self-pay | Attending: Internal Medicine

## 2020-04-13 DIAGNOSIS — Z23 Encounter for immunization: Secondary | ICD-10-CM

## 2020-04-13 NOTE — Progress Notes (Signed)
   Covid-19 Vaccination Clinic  Name:  Terry Castillo    MRN: [id number] DOB: 1966/04/19  04/13/2020  [name] was observed post Covid-19 immunization for 15 minutes without incident. He was provided with Vaccine Information Sheet and instruction to access the V-Safe system.   [name] was instructed to call 911 with any severe reactions post vaccine: Marland Kitchen Difficulty breathing  . Swelling of face and throat  . A fast heartbeat  . A bad rash all over body  . Dizziness and weakness   Immunizations Administered    Name Date Dose VIS Date Route   Moderna COVID-19 Vaccine 04/13/2020  9:33 AM 0.5 mL 03/19/2020 Intramuscular   Manufacturer: Moderna   Lot: [id number]   NDC: 16606-301-60

## 2020-04-29 ENCOUNTER — Encounter: Payer: Self-pay | Admitting: Student

## 2020-04-29 ENCOUNTER — Telehealth: Payer: Self-pay | Admitting: *Deleted

## 2020-04-29 MED FILL — LEVEMIR FLEXTOUCH 100 UNITS: 100 | 30 days supply | Qty: 9 | Fill #1

## 2020-04-29 MED FILL — VICTOZA 18 MG/3 ML INJECT P: 18 | 30 days supply | Qty: 9 | Fill #1

## 2020-04-29 NOTE — Telephone Encounter (Signed)
Pt did not come to his appt this morning. Called pt to re-schedule the appt - no answer, left message to call the office.

## 2020-05-06 ENCOUNTER — Ambulatory Visit (INDEPENDENT_AMBULATORY_CARE_PROVIDER_SITE_OTHER): Payer: Self-pay | Admitting: Internal Medicine

## 2020-05-06 VITALS — BP 148/87 | HR 90 | Temp 98.3°F | Wt 175.7 lb

## 2020-05-06 DIAGNOSIS — Z1211 Encounter for screening for malignant neoplasm of colon: Secondary | ICD-10-CM | POA: Insufficient documentation

## 2020-05-06 DIAGNOSIS — E119 Type 2 diabetes mellitus without complications: Secondary | ICD-10-CM

## 2020-05-06 DIAGNOSIS — Z794 Long term (current) use of insulin: Secondary | ICD-10-CM

## 2020-05-06 LAB — GLUCOSE, CAPILLARY: Glucose-Capillary: 239 mg/dL — ABNORMAL HIGH (ref 70–99)

## 2020-05-06 MED ORDER — INSULIN DETEMIR 100 UNIT/ML FLEXPEN: 35.0000 [IU] | PEN_INJECTOR | Freq: Every day | SUBCUTANEOUS | 2 refills | Status: DC

## 2020-05-06 NOTE — Assessment & Plan Note (Signed)
He has never had a colonoscopy but currently does not have insurance. We discussed the importance of colon cancer screening. Unfortunately, FIT test is too expensive at this time. He has no changes in BM, abdominal pain, diarrhea, constipation, BRBR or dark tarry stools. He would like to readdress in the future as able.

## 2020-05-06 NOTE — Progress Notes (Signed)
   CC: TIIDM  HPI:  [name] is a 54 y.o. with PMH as below.   Please see A&P for assessment of the patient's acute and chronic medical conditions.   Past Medical History:  Diagnosis Date  . Alcohol abuse   . Atrial fibrillation with RVR ([facility]) 2006   spontaneous conversion to sinus rhythm  . Diabetes mellitus    uncontrolled  . History of tobacco use   . Onychomycosis    Review of Systems:   Review of Systems  Constitutional: Negative for chills, diaphoresis and weight loss.  Gastrointestinal: Negative for abdominal pain, diarrhea, nausea and vomiting.  Genitourinary: Negative for dysuria, frequency and urgency.  Neurological: Negative for dizziness, tingling, weakness and headaches.   Physical Exam: Constitution: NAD, appears stated age Eyes: no icterus or injection  Cardio: RRR, no m/r/g, no LE edema  Respiratory: non-labored breathing, on room air Neuro: normal affect, a&ox3 Skin: c/d/i    Vitals:   05/06/20 0949  BP: (!) 148/87  Pulse: 90  Temp: 98.3 F (36.8 C)  TempSrc: Oral  SpO2: 100%  Weight: 175 lb 11.2 oz (79.7 kg)    Assessment & Plan:   See Encounters Tab for problem based charting.  Patient discussed with Dr. Oswaldo Done

## 2020-05-06 NOTE — Patient Instructions (Addendum)
Thank you for allowing [place] to provide your care today. Today we discussed your type II diabetes    Please START taking  levemir 35U every evening   Please call the clinic at [phone] to let us know what dose of Victoza you are taking.   Please follow-up in two weeks. Please bring your glucose readings with you.    Please also bring proof of income amount at follow-up.   Please call the internal medicine center clinic if you have any questions or concerns, we may be able to help and keep you from a long and expensive emergency room wait. Our clinic and after hours phone number is [phone], the best time to call is Monday through Friday 9 am to 4 pm but there is always someone available 24/7 if you have an emergency. If you need medication refills please notify your pharmacy one week in advance and they will send [place] a request.

## 2020-05-06 NOTE — Assessment & Plan Note (Addendum)
He was able to pick up levemir 30U and victoza late in November and is taking both in addition to his synjardy. He checks glucose in am before breakfast and before bed but forgot to bring his glucose readings in. They have been ranging from 200-250, currently 240. He denies symptoms of hypoglycemia. He has not heard from the clinic concerning patient assistance for xultophy.   - increase levemir to 35U qhs, f/u in two weeks, discussed writing down and bringing in am and pm glucose readings.  - continue victoza - continue synjardy xr 02-999 mg qd - discussed with pharmacist and forwarded information to start work on patient assistance

## 2020-05-07 NOTE — Progress Notes (Signed)
Internal Medicine Clinic Attending  Case discussed with Dr. Seawell  At the time of the visit.  We reviewed the resident's history and exam and pertinent patient test results.  I agree with the assessment, diagnosis, and plan of care documented in the resident's note.  

## 2020-05-12 ENCOUNTER — Ambulatory Visit: Payer: Self-pay | Attending: Internal Medicine

## 2020-05-12 DIAGNOSIS — Z23 Encounter for immunization: Secondary | ICD-10-CM

## 2020-05-12 NOTE — Progress Notes (Signed)
   Covid-19 Vaccination Clinic  Name:  Terry Castillo    MRN: [id number] DOB: May 20, 1966  05/12/2020  [name] was observed post Covid-19 immunization for 15 minutes without incident. He was provided with Vaccine Information Sheet and instruction to access the V-Safe system.   [name] was instructed to call 911 with any severe reactions post vaccine: Marland Kitchen Difficulty breathing  . Swelling of face and throat  . A fast heartbeat  . A bad rash all over body  . Dizziness and weakness   Immunizations Administered    Name Date Dose VIS Date Route   Moderna COVID-19 Vaccine 05/12/2020  1:20 PM 0.5 mL 03/19/2020 Intramuscular   Manufacturer: Moderna   Lot: [id number]   NDC: 88719-597-47

## 2020-05-20 ENCOUNTER — Encounter: Payer: Self-pay | Admitting: Internal Medicine

## 2020-05-20 ENCOUNTER — Other Ambulatory Visit: Payer: Self-pay | Admitting: Internal Medicine

## 2020-05-20 ENCOUNTER — Other Ambulatory Visit: Payer: Self-pay

## 2020-05-20 ENCOUNTER — Ambulatory Visit (INDEPENDENT_AMBULATORY_CARE_PROVIDER_SITE_OTHER): Payer: Self-pay | Admitting: Internal Medicine

## 2020-05-20 VITALS — BP 142/71 | HR 90 | Temp 98.1°F | Wt 180.6 lb

## 2020-05-20 DIAGNOSIS — E119 Type 2 diabetes mellitus without complications: Secondary | ICD-10-CM

## 2020-05-20 DIAGNOSIS — Z794 Long term (current) use of insulin: Secondary | ICD-10-CM

## 2020-05-20 LAB — GLUCOSE, CAPILLARY: Glucose-Capillary: 221 mg/dL — ABNORMAL HIGH (ref 70–99)

## 2020-05-20 MED ORDER — SYNJARDY XR 25-1000 MG PO TB24: 1.0000 | ORAL_TABLET | Freq: Every day | ORAL | 5 refills | Status: DC

## 2020-05-20 MED ORDER — XULTOPHY 100-3.6 UNIT-MG/ML ~~LOC~~ SOPN: 35.0000 [IU] | PEN_INJECTOR | Freq: Every day | SUBCUTANEOUS | 5 refills | Status: DC

## 2020-05-20 MED FILL — SYNJARDY XR 25-1000 MG TB24: 25-1000 | 30 days supply | Qty: 30 | Fill #0

## 2020-05-20 NOTE — Patient Instructions (Addendum)
Thank you for allowing [place] to provide your care today. Today we discussed your type II diabetes  lowing changes to your medications:   Please START taking  synjardy 25-1000 mg - take one tablet daily   Please STOP taking   synjardy 02-999 mg  Please follow-up in one month for a1c check.    Please call the internal medicine center clinic if you have any questions or concerns, we may be able to help and keep you from a long and expensive emergency room wait. Our clinic and after hours phone number is [phone], the best time to call is Monday through Friday 9 am to 4 pm but there is always someone available 24/7 if you have an emergency. If you need medication refills please notify your pharmacy one week in advance and they will send [place] a request.

## 2020-05-20 NOTE — Progress Notes (Signed)
   CC: type II diabetes  HPI:  [name] is a 54 y.o. with PMH as below.   Please see A&P for assessment of the patient's acute and chronic medical conditions.   He has been checking sugars at home but does not have his meter with him today. He states they are around 150 before and after meals. Spot glucose is 221 today, 240 during last visit. No hypoglycemia. He has paperwork with him to work on obtaining xutolphy.   - increase synjardy to 25-1000 24 hr - cont. levemir 35U qhs - cont. victoza  - f/u in one month for a1c  - will fill out paperwork today with Dr. Nicholaus Bloom  Past Medical History:  Diagnosis Date  . Alcohol abuse   . Atrial fibrillation with RVR ([facility]) 2006   spontaneous conversion to sinus rhythm  . Diabetes mellitus    uncontrolled  . History of tobacco use   . Onychomycosis    Review of Systems:  Review of Systems  Constitutional: Negative for diaphoresis and malaise/fatigue.  Genitourinary: Negative for frequency and urgency.  Neurological: Negative for dizziness, tremors and headaches.   Physical Exam :   Constitution: NAD, appears stated age HEENT: [place]/AT, no icterus or injection  Cardio: RRR, no m/r/g, no LE edema  Respiratory: non-labored breathing, on room air MSK: moving all extremities Neuro: normal affect, a&ox3 Skin: c/d/i   Vitals:   05/20/20 1020  BP: (!) 142/71  Pulse: 90  Temp: 98.1 F (36.7 C)  TempSrc: Oral  SpO2: 97%  Weight: 180 lb 9.6 oz (81.9 kg)    Assessment & Plan:   See Encounters Tab for problem based charting.  Patient discussed with Dr. Cleda Daub

## 2020-05-21 NOTE — Progress Notes (Signed)
Internal Medicine Clinic Attending  Case discussed with Dr. Seawell  At the time of the visit.  We reviewed the resident's history and exam and pertinent patient test results.  I agree with the assessment, diagnosis, and plan of care documented in the resident's note.  

## 2020-05-22 ENCOUNTER — Telehealth: Payer: Self-pay

## 2020-05-22 NOTE — Telephone Encounter (Signed)
Completed medication assistance applications for Xultophy, Levemir, Victoza(Novo Nordisk) and Xarelto (JJPAF) faxed to companies today for processing.

## 2020-05-29 ENCOUNTER — Other Ambulatory Visit: Payer: Self-pay | Admitting: Student

## 2020-05-29 MED FILL — SYNJARDY XR 25-1000 MG TB24: 25-1000 | 30 days supply | Qty: 30 | Fill #0

## 2020-05-29 MED FILL — LEVEMIR FLEXTOUCH 100 UNITS: 100 | 30 days supply | Qty: 9 | Fill #2

## 2020-06-03 ENCOUNTER — Other Ambulatory Visit: Payer: Self-pay | Admitting: Student

## 2020-06-03 MED ORDER — VICTOZA 18 MG/3ML ~~LOC~~ SOPN: PEN_INJECTOR | SUBCUTANEOUS | 1 refills | Status: DC

## 2020-06-03 NOTE — Progress Notes (Unsigned)
Received a message from pharmacy tech to resend prescription through IM program.

## 2020-06-04 ENCOUNTER — Ambulatory Visit: Payer: Self-pay

## 2020-06-04 MED FILL — VICTOZA 18 MG/3 ML INJECT P: 18 | 30 days supply | Qty: 9 | Fill #0

## 2020-06-05 ENCOUNTER — Telehealth: Payer: Self-pay | Admitting: Pharmacist

## 2020-06-05 NOTE — Telephone Encounter (Signed)
I called patient to let him know his Novo Patient Assistance Medications (Xultophy and pen needles) arrived to the clinic today. Patient stated he will come to clinic tomorrow afternoon to pick up medications.  Medications are located in clear zip-lock bag in sample room fridge with a paper stating patient's name inside. Norm Parcel, RD, will give patient his medications tomorrow when he presents.  Removed insulin detemir (Levemir) and liraglutide (Victoza) from patient med list as insulin degludec-liraglutide (Xultophy) will now be replacing these two medications.

## 2020-06-06 NOTE — Telephone Encounter (Signed)
Patient presented to the office today and was given his Novo Patient Assistance medications. He verbalized understanding to how to take them correctly.

## 2020-06-06 NOTE — Telephone Encounter (Signed)
Thank you so much Lupita Leash! I appreciate your help!

## 2020-06-19 ENCOUNTER — Telehealth: Payer: Self-pay

## 2020-06-19 MED FILL — XARELTO 20 MG TABLET: 20 | 30 days supply | Qty: 30 | Fill #0

## 2020-06-19 NOTE — Telephone Encounter (Signed)
Left a voicemail for patient today AT 9:35AM letting him know that his application for Xarelto patient assistance was approved until 06/18/2021.  I called [facility] to have them process the prescription and it did go thru no charge to patient and they are in the process of filling it now for him.  The JJPAF card for pharmacy billing is BIN: [id number] ID: [id number] GRP: [id number]

## 2020-07-02 NOTE — Addendum Note (Signed)
Addended by: Neomia Dear on: 07/02/2020 06:25 PM   Modules accepted: Orders

## 2020-08-08 MED FILL — SYNJARDY XR 25-1000 MG TB24: 25-1000 | 30 days supply | Qty: 30 | Fill #1

## 2020-08-18 ENCOUNTER — Telehealth: Payer: Self-pay | Admitting: Dietician

## 2020-08-18 ENCOUNTER — Other Ambulatory Visit: Payer: Self-pay

## 2020-08-18 ENCOUNTER — Ambulatory Visit (INDEPENDENT_AMBULATORY_CARE_PROVIDER_SITE_OTHER): Payer: Self-pay | Admitting: Internal Medicine

## 2020-08-18 ENCOUNTER — Other Ambulatory Visit: Payer: Self-pay | Admitting: Internal Medicine

## 2020-08-18 ENCOUNTER — Encounter: Payer: Self-pay | Admitting: Internal Medicine

## 2020-08-18 VITALS — BP 139/80 | HR 91 | Temp 98.4°F | Wt 182.6 lb

## 2020-08-18 DIAGNOSIS — E119 Type 2 diabetes mellitus without complications: Secondary | ICD-10-CM

## 2020-08-18 DIAGNOSIS — M7022 Olecranon bursitis, left elbow: Secondary | ICD-10-CM | POA: Insufficient documentation

## 2020-08-18 DIAGNOSIS — Z794 Long term (current) use of insulin: Secondary | ICD-10-CM

## 2020-08-18 LAB — POCT GLYCOSYLATED HEMOGLOBIN (HGB A1C): Hemoglobin A1C: 9.3 % — AB (ref 4.0–5.6)

## 2020-08-18 LAB — GLUCOSE, CAPILLARY: Glucose-Capillary: 188 mg/dL — ABNORMAL HIGH (ref 70–99)

## 2020-08-18 MED ORDER — XULTOPHY 100-3.6 UNIT-MG/ML ~~LOC~~ SOPN
38.0000 [IU] | PEN_INJECTOR | Freq: Every day | SUBCUTANEOUS | 5 refills | Status: DC
Start: 2020-08-18 — End: 2020-10-01

## 2020-08-18 MED ORDER — SYNJARDY XR 12.5-1000 MG PO TB24
2.0000 | ORAL_TABLET | Freq: Every day | ORAL | 5 refills | Status: DC
Start: 2020-08-18 — End: 2020-08-18

## 2020-08-18 MED FILL — SYNJARDY XR 12.5-1,000 MG T: 12.5-1000 | 30 days supply | Qty: 60 | Fill #0

## 2020-08-18 NOTE — Telephone Encounter (Signed)
Schedule retinal camera appointment.

## 2020-08-18 NOTE — Assessment & Plan Note (Signed)
For the past two weeks he has noticed swelling over his left elbow. Never occurred before. It is not painful at rest or with movement. No recent injury but does frequently rest his elbow on armchairs. Swelling has improved with a compression sleeve has been wearing. No signs or symptoms of infection. Physical exam consistent with olecranon bursitis.   - dicussed continuing compression, can wrap with ace bandage and ice - avoid resting on the elbow - f/u if pain, redness or systemic symptoms occur.

## 2020-08-18 NOTE — Assessment & Plan Note (Signed)
Last a1c 02/2020 10.9. Today 9.3. Current medications include Xultophy 35U, and synjardy xr 25-1000 mg xr qd. His a1c was increased previously as he had run out of his xultophy at that time. Recently glucose has been around 140-150 in the mornings when he checks it. He has had no difficulties with his medications and takes them daily.  - increase synjardy to 25-2000 mg xr - increase xultophy 38U qd  - f/u with Lupita Leash for retinopathy exam  - urine microalbumin today  - please check glucose before and after breakfast - f/u in two weeks with glucose readings

## 2020-08-18 NOTE — Patient Instructions (Addendum)
Thank you for allowing [place] to provide your care today. Today we discussed your type II diabetes.   I have ordered the following labs for you:    I will call if any are abnormal.    Today we made the following changes to your medications:    Please increase your Xultophy to 38U qhs.   Please start taking synjardy 12.09-998 mg - take 2 tablets daily    Please follow-up in two weeks with glucose readings. Please check before and after breakfast if possible.     For your olecranon bursitis, apply ice and wrap with ace bandage. You can also continue compression.   Please call the internal medicine center clinic if you have any questions or concerns, we may be able to help and keep you from a long and expensive emergency room wait. Our clinic and after hours phone number is [phone], the best time to call is Monday through Friday 9 am to 4 pm but there is always someone available 24/7 if you have an emergency. If you need medication refills please notify your pharmacy one week in advance and they will send [place] a request.

## 2020-08-18 NOTE — Progress Notes (Signed)
   CC: elbow swelling   HPI:  [name] is a 55 y.o. with PMH as below.   Please see A&P for assessment of the patient's acute and chronic medical conditions.   Last a1c 02/2020 10.9. Today 9.3. Current medications include Xultophy 35U, and synjardy xr 25-1000 mg xr qd. His a1c was increased previously as he had run out of his xultophy at that time. Recently glucose has been around 140-150 in the mornings when he checks it. He has had no difficulties with his medications and takes them daily.  For the past two weeks he has noticed swelling over his left elbow. Never occurred before. It is not painful at rest or with movement. No recent injury but does frequently rest his elbow on armchairs. Swelling has improved with a compression sleeve has been wearing. No signs or symptoms of infection. Physical exam consistent with olecranon bursitis.   Past Medical History:  Diagnosis Date  . Alcohol abuse   . Atrial fibrillation with RVR ([facility]) 2006   spontaneous conversion to sinus rhythm  . Diabetes mellitus    uncontrolled  . History of tobacco use   . Onychomycosis    Review of Systems:   Review of Systems  Genitourinary: Negative for dysuria, frequency and urgency.  Musculoskeletal: Negative for back pain and joint pain.  Skin: Negative for itching and rash.  Neurological: Negative for tingling and focal weakness.  Endo/Heme/Allergies: Negative for polydipsia.   Physical Exam: Constitution: NAD, appears stated age Cardio: RRR, no m/r/g, no LE edema  Respiratory: CTA, no w/r/r MSK: superficial soft swelling posterior left elbow without erythema or warmth, NTTP, full active and passive ROM without pain; no swelling over right elbow.  Neuro: normal affect, a&o, pleasant Skin: c/d/i    Vitals:   08/18/20 1315  BP: (!) 153/82  Pulse: 90  SpO2: 99%  Weight: 182 lb 9.6 oz (82.8 kg)    Assessment & Plan:   See Encounters Tab for problem based charting.  Patient discussed  with Dr. Oswaldo Done

## 2020-08-19 LAB — MICROALBUMIN / CREATININE URINE RATIO
Creatinine, Urine: 78.5 mg/dL
Microalb/Creat Ratio: <4 mg/g creat (ref 0–29)
Microalbumin, Urine: <3 ug/mL

## 2020-08-19 NOTE — Progress Notes (Signed)
Internal Medicine Clinic Attending  I saw and evaluated the patient.  I personally confirmed the key portions of the history and exam documented by Dr. Cleaster Corin and I reviewed pertinent patient test results.  The assessment, diagnosis, and plan were formulated together and I agree with the documentation in the resident's note.   Uncomplicated left olecranon bursitis. No signs of infection, I advised supportive care and we discussed the benign nature of this condition. Risks of aspiration most often outweighs the benefits.

## 2020-08-28 ENCOUNTER — Ambulatory Visit: Payer: Self-pay | Admitting: Dietician

## 2020-08-28 DIAGNOSIS — E119 Type 2 diabetes mellitus without complications: Secondary | ICD-10-CM

## 2020-08-28 DIAGNOSIS — Z794 Long term (current) use of insulin: Secondary | ICD-10-CM

## 2020-08-28 LAB — HM DIABETES EYE EXAM

## 2020-08-28 NOTE — Progress Notes (Signed)
Retinal images were done today as part of this patient's yearly diabetes health maintenance program. They were transmitted electronically to an ophthalmologist who will interpret them and send a report back with their findings. This was explained to the patient and they were also informed that the results would be communicated to them either by phone, mail or both.   

## 2020-08-30 ENCOUNTER — Other Ambulatory Visit (HOSPITAL_COMMUNITY): Payer: Self-pay

## 2020-09-05 ENCOUNTER — Encounter: Payer: Self-pay | Admitting: Dietician

## 2020-09-09 ENCOUNTER — Other Ambulatory Visit (HOSPITAL_COMMUNITY): Payer: Self-pay

## 2020-09-16 ENCOUNTER — Telehealth: Payer: Self-pay

## 2020-09-16 NOTE — Telephone Encounter (Signed)
Left message with patient that medication has been delivered to the office (xultophy) and is ready for pickup.  *medication labeled and ready in med fridge*

## 2020-10-01 ENCOUNTER — Other Ambulatory Visit: Payer: Self-pay

## 2020-10-01 DIAGNOSIS — Z794 Long term (current) use of insulin: Secondary | ICD-10-CM

## 2020-10-01 DIAGNOSIS — E119 Type 2 diabetes mellitus without complications: Secondary | ICD-10-CM

## 2020-10-01 MED ORDER — XULTOPHY 100-3.6 UNIT-MG/ML ~~LOC~~ SOPN: 38.0000 [IU] | PEN_INJECTOR | Freq: Every day | SUBCUTANEOUS | 5 refills | Status: DC

## 2020-10-01 NOTE — Telephone Encounter (Signed)
Need refill on Insulin Degludec-Liraglutide (XULTOPHY) 100-3.6 UNIT-MG/ML SOPN ;pt contact [phone]   Pls contact pt for pharmacy

## 2020-10-07 ENCOUNTER — Other Ambulatory Visit (HOSPITAL_COMMUNITY): Payer: Self-pay

## 2020-10-07 ENCOUNTER — Telehealth: Payer: Self-pay

## 2020-10-07 NOTE — Telephone Encounter (Signed)
Patient called in stating he was completely out of Xultophy. Explained a refill was sent to [facility] on 10/01/20. He was given their contact number. He will call back if there is anything further needed.

## 2021-01-27 NOTE — Telephone Encounter (Signed)
Pt picked up medication.   Gave pt 4 boxes (157 day supply). Pt says he never rec'd call or message for med pickup. Pt aware he will need to re enroll in program around nov 2022.

## 2021-07-08 NOTE — Progress Notes (Signed)
Received notification from [employer] regarding approval for XULTOPHY 100/3.6. Patient assistance approved from 07/08/21 to 05/30/22.   Shipment processing. Shipments are currently about 6 weeks out. Medication will ship to the office. Should return to 10-14 business day shipments by March 2023.  Phone: [phone]

## 2021-07-09 NOTE — Progress Notes (Signed)
Received notification from [place] Terry Castillo & Terry Castillo) regarding approval for XARELTO. Patient assistance approved from 06/08/21 to 06/08/22.  PT WILL RECEIVE COPAY CARD TO TAKE TO PHARMACY OF THEIR CHOICE & FILL MEDICATION.   Phone: [phone]

## 2021-07-10 ENCOUNTER — Telehealth: Payer: Self-pay | Admitting: *Deleted

## 2021-07-10 NOTE — Telephone Encounter (Signed)
Yes. Agree that he needs a new evaluation to determine which therapy would be best.  We won't necessarily continue the Xultophy; fresh look next week.  No refill treatment for the next few days since he's been without for so long and is asymptomatic.

## 2021-07-10 NOTE — Telephone Encounter (Signed)
Patient walked stating he is completely out of insulin. He has been approved for Xultophy, however, it will take 6-8 weeks for shipment to office. There are no samples to offer. Please advise.

## 2021-07-10 NOTE — Telephone Encounter (Addendum)
Patient's last OV was 08/18/2020. He has scheduled appt with PCP for 2/15 at 1515. He is asked to bring all meds, insulin pens with him to this visit.

## 2021-07-10 NOTE — Telephone Encounter (Signed)
Patient states he has not had Xultophy in 2 months but he was given victoza and "another insulin" 2 weeks ago by our office. He states he has been p/u samples of these 2 insulins from [place] every month. States he ran out of Victoza yesterday but has one pen left of the "other insulin."

## 2021-07-10 NOTE — Telephone Encounter (Signed)
Per Dr. Mayford Knife, he can continue the insulin until his visit next week

## 2021-07-15 ENCOUNTER — Ambulatory Visit: Payer: Self-pay | Admitting: Student

## 2021-07-15 ENCOUNTER — Other Ambulatory Visit (HOSPITAL_COMMUNITY): Payer: Self-pay

## 2021-07-15 ENCOUNTER — Encounter: Payer: Self-pay | Admitting: Student

## 2021-07-15 VITALS — BP 136/74 | HR 96 | Temp 98.2°F | Wt 185.6 lb

## 2021-07-15 DIAGNOSIS — I480 Paroxysmal atrial fibrillation: Secondary | ICD-10-CM

## 2021-07-15 DIAGNOSIS — E785 Hyperlipidemia, unspecified: Secondary | ICD-10-CM

## 2021-07-15 DIAGNOSIS — E119 Type 2 diabetes mellitus without complications: Secondary | ICD-10-CM

## 2021-07-15 DIAGNOSIS — Z794 Long term (current) use of insulin: Secondary | ICD-10-CM

## 2021-07-15 LAB — POCT GLYCOSYLATED HEMOGLOBIN (HGB A1C): Hemoglobin A1C: 10.7 % — AB (ref 4.0–5.6)

## 2021-07-15 LAB — GLUCOSE, CAPILLARY: Glucose-Capillary: 443 mg/dL — ABNORMAL HIGH (ref 70–99)

## 2021-07-15 MED ORDER — INSULIN ASPART PROT & ASPART (70-30 MIX) 100 UNIT/ML ~~LOC~~ SUSP
10.0000 [IU] | Freq: Two times a day (BID) | SUBCUTANEOUS | 6 refills | Status: DC
  Filled 2021-07-15: qty 10, 28d supply, fill #0
  Filled 2021-08-12 (×2): qty 10, 28d supply, fill #1

## 2021-07-15 MED ORDER — CONTOUR NEXT TEST VI STRP
ORAL_STRIP | 5 refills | Status: AC
  Filled 2021-07-15: qty 100, 33d supply, fill #0

## 2021-07-15 MED ORDER — "INSULIN SYRINGE-NEEDLE U-100 31G X 5/16"" 0.5 ML MISC"
1.0000 | Freq: Two times a day (BID) | 11 refills | Status: DC
  Filled 2021-07-15: qty 60, 30d supply, fill #0

## 2021-07-15 MED ORDER — RIVAROXABAN 20 MG PO TABS
20.0000 mg | ORAL_TABLET | Freq: Every day | ORAL | 11 refills | Status: AC
Start: 2021-07-15 — End: ?
  Filled 2021-07-15: qty 30, 30d supply, fill #0

## 2021-07-15 MED ORDER — METFORMIN HCL 500 MG PO TABS
1000.0000 mg | ORAL_TABLET | Freq: Two times a day (BID) | ORAL | 11 refills | Status: DC
  Filled 2021-07-15: qty 120, 30d supply, fill #0
  Filled 2021-09-21: qty 120, 30d supply, fill #1
  Filled 2021-11-11: qty 120, 30d supply, fill #2
  Filled 2022-01-08: qty 120, 30d supply, fill #3
  Filled 2022-03-15: qty 120, 30d supply, fill #4

## 2021-07-15 MED ORDER — MICROLET LANCETS MISC
5 refills | Status: AC
  Filled 2021-07-15: qty 100, 33d supply, fill #0

## 2021-07-15 MED ORDER — ATORVASTATIN CALCIUM 40 MG PO TABS
40.0000 mg | ORAL_TABLET | Freq: Every day | ORAL | 11 refills | Status: DC
  Filled 2021-07-15: qty 30, 30d supply, fill #0

## 2021-07-15 NOTE — Assessment & Plan Note (Signed)
Patient with history of uncontrolled type 2 diabetes, previously on Xultophy and Synjardy XR.  He has been unable to receive any of his diabetic medications for over 3 months now as he was not able to obtain financial assistance.  He has not been checking his glucose levels but does note that he has been experiencing symptoms of polyuria, polydipsia.  Previous A1c of 9.3% 11 months ago.  Today, A1c 10.7%.  Given that patient has been off of antidiabetic medications for quite a while now, expected A1c to increase.  In order to optimize glycemic control while also taking into account financial situation, will discontinue Xultophy and Synjardy at this time as these are not covered by IM program.  Instead, after speaking with patient and with Lupita Leash, decided to start patient on NovoLog 70/30 at 10 units twice daily before meals along with metformin 1000 mg twice daily with meals through the IM program.  I am hopeful that this will provide better glycemic control and bridge patient until we can get him on more optimal glycemic coverage.  Also provided patient with orange card and CAFA letter in hopes that this will provide some insurance coverage.  Plan: -Stopped Xultophy and Synjardy -Start NovoLog 70/30 at 10 units twice daily with before meals -Start metformin 1000 mg twice daily with meals -Fill out orange card and CAFA letter for insurance coverage -Follow-up in 3 months for repeat A1c

## 2021-07-15 NOTE — Progress Notes (Signed)
° °  CC: Medication refills  HPI:  Terry Castillo is a 56 y.o. male with history listed below presenting to the [facility] for medication refills, follow-up of type 2 diabetes. Please see individualized problem based charting for full HPI.  Past Medical History:  Diagnosis Date   Alcohol abuse    Atrial fibrillation with RVR (HCC) 2006   spontaneous conversion to sinus rhythm   Diabetes mellitus    uncontrolled   History of tobacco use    Onychomycosis     Review of Systems:  Negative aside from that listed in individualized problem based charting.  Physical Exam:  Vitals:   07/15/21 1452  BP: 136/74  Pulse: 96  Temp: 98.2 F (36.8 C)  TempSrc: Oral  SpO2: 99%   Physical Exam Constitutional:      Appearance: Normal appearance.  HENT:     Mouth/Throat:     Mouth: Mucous membranes are moist.     Pharynx: Oropharynx is clear.  Eyes:     Extraocular Movements: Extraocular movements intact.     Conjunctiva/sclera: Conjunctivae normal.     Pupils: Pupils are equal, round, and reactive to light.  Cardiovascular:     Rate and Rhythm: Normal rate and regular rhythm.     Pulses: Normal pulses.     Heart sounds: Normal heart sounds. No murmur heard.   No gallop.  Pulmonary:     Effort: Pulmonary effort is normal.     Breath sounds: Normal breath sounds. No wheezing, rhonchi or rales.  Abdominal:     General: Bowel sounds are normal. There is no distension.     Palpations: Abdomen is soft.     Tenderness: There is no abdominal tenderness.  Musculoskeletal:        General: Normal range of motion.  Skin:    General: Skin is warm and dry.  Neurological:     General: No focal deficit present.     Mental Status: He is alert and oriented to person, place, and time.  Psychiatric:        Mood and Affect: Mood normal.        Behavior: Behavior normal.     Assessment & Plan:   See Encounters Tab for problem based charting.  Patient discussed with Dr. Criselda Peaches

## 2021-07-15 NOTE — Patient Instructions (Signed)
Mr. Lawless,  It was a pleasure seeing you in the clinic today.   I have refilled all of your medications and sent them to the [facility]. Each medication will cost about $4 for a 30-day supply.  For your diabetes, you will be starting on Novolog 70/30 insulin (10 units twice daily before meals) and metformin twice daily with meals. Please come back in 3 months for follow up visit. By the way, you will also need to pick up pen needles, lancets, and test strips along with your insulin and metformin. I also refilled your cholesterol medicine (lipitor/atorvastatin) and your blood thinner (xarelto).   Please call our clinic at [phone] if you have any questions or concerns. The best time to call is Monday-Friday from 9am-4pm, but there is someone available 24/7 at the same number. If you need medication refills, please notify your pharmacy one week in advance and they will send [place] a request.   Thank you for letting us take part in your care. We look forward to seeing you next time!

## 2021-07-15 NOTE — Assessment & Plan Note (Signed)
Patient with history of hyperlipidemia, previously on Lipitor 40 mg daily.  He has been out of his Lipitor for over 3 months now.  Placed refill for Lipitor through IM program.  -Refilled Lipitor 40 mg daily through IM program

## 2021-07-15 NOTE — Assessment & Plan Note (Signed)
Patient reports that he has not been taking his Xarelto for over 3 months now.  He was initially started on Xarelto due to paroxysmal A-fib that was thought to be secondary to alcohol intoxication and that resolved spontaneously.  Will restart Xarelto at this time as he has been off for over 3 months.  Refilled Xarelto through IM program.

## 2021-07-23 ENCOUNTER — Other Ambulatory Visit (HOSPITAL_COMMUNITY): Payer: Self-pay

## 2021-07-23 NOTE — Progress Notes (Signed)
Internal Medicine Clinic Attending  Case discussed with Dr. Jinwala at the time of the visit.  We reviewed the resident's history and exam and pertinent patient test results.  I agree with the assessment, diagnosis, and plan of care documented in the resident's note.  

## 2021-08-12 ENCOUNTER — Other Ambulatory Visit: Payer: Self-pay | Admitting: Student

## 2021-08-12 ENCOUNTER — Other Ambulatory Visit (HOSPITAL_COMMUNITY): Payer: Self-pay

## 2021-08-12 DIAGNOSIS — E119 Type 2 diabetes mellitus without complications: Secondary | ICD-10-CM

## 2021-08-12 MED ORDER — NOVOLOG MIX 70/30 FLEXPEN (70-30) 100 UNIT/ML ~~LOC~~ SUPN
10.0000 [IU] | PEN_INJECTOR | Freq: Two times a day (BID) | SUBCUTANEOUS | 4 refills | Status: DC
  Filled 2021-08-12: qty 6, 30d supply, fill #0
  Filled 2021-11-11: qty 6, 30d supply, fill #1

## 2021-08-12 MED ORDER — INSULIN PEN NEEDLE 32G X 4 MM MISC
1.0000 "application " | Freq: Every day | 3 refills | Status: DC
  Filled 2021-08-12: qty 100, 30d supply, fill #0
  Filled 2022-01-27: qty 100, 30d supply, fill #1
  Filled 2022-03-15: qty 100, 30d supply, fill #2
  Filled 2022-05-05: qty 100, 30d supply, fill #3

## 2021-09-02 ENCOUNTER — Telehealth: Payer: Self-pay

## 2021-09-02 NOTE — Telephone Encounter (Signed)
Left message informing patient his novo nordisk medication is ready for pickup here at the office. ? ?2 boxes of novolog mix 70/30 flexpens are labeled and ready in med room fridge. ?

## 2021-09-08 NOTE — Telephone Encounter (Signed)
2 boxes of novolog mix 70/30 flexpens and 1 box of pen needles handed to patient. ?

## 2021-09-21 ENCOUNTER — Other Ambulatory Visit (HOSPITAL_COMMUNITY): Payer: Self-pay

## 2021-11-11 ENCOUNTER — Other Ambulatory Visit (HOSPITAL_COMMUNITY): Payer: Self-pay

## 2021-12-16 ENCOUNTER — Telehealth: Payer: Self-pay

## 2021-12-16 NOTE — Telephone Encounter (Signed)
Pt presented to pickup medication 

## 2021-12-16 NOTE — Telephone Encounter (Signed)
Informed pt his novo nordisk medication shipment is ready for pickup here at the office.  2 boxes of novolog mix are ready and labeled in med room fridge

## 2022-01-08 ENCOUNTER — Other Ambulatory Visit (HOSPITAL_COMMUNITY): Payer: Self-pay

## 2022-01-27 ENCOUNTER — Other Ambulatory Visit (HOSPITAL_COMMUNITY): Payer: Self-pay

## 2022-03-10 ENCOUNTER — Telehealth: Payer: Self-pay

## 2022-03-10 NOTE — Telephone Encounter (Signed)
Left message informing pt his novo nordisk shipment is ready for pickup.  2 boxes of novolog mix 70/30 flexpen are labeled and ready in med room fridge.

## 2022-03-15 ENCOUNTER — Other Ambulatory Visit (HOSPITAL_COMMUNITY): Payer: Self-pay

## 2022-05-05 ENCOUNTER — Other Ambulatory Visit (HOSPITAL_COMMUNITY): Payer: Self-pay

## 2022-05-19 ENCOUNTER — Encounter: Payer: Self-pay | Admitting: Student

## 2022-05-19 ENCOUNTER — Other Ambulatory Visit (HOSPITAL_COMMUNITY): Payer: Self-pay

## 2022-05-19 ENCOUNTER — Other Ambulatory Visit: Payer: Self-pay

## 2022-05-19 ENCOUNTER — Ambulatory Visit (INDEPENDENT_AMBULATORY_CARE_PROVIDER_SITE_OTHER): Payer: Self-pay | Admitting: Student

## 2022-05-19 VITALS — BP 136/63 | HR 92 | Temp 97.9°F | Ht 73.0 in | Wt 183.8 lb

## 2022-05-19 DIAGNOSIS — F17210 Nicotine dependence, cigarettes, uncomplicated: Secondary | ICD-10-CM

## 2022-05-19 DIAGNOSIS — I480 Paroxysmal atrial fibrillation: Secondary | ICD-10-CM

## 2022-05-19 DIAGNOSIS — Z794 Long term (current) use of insulin: Secondary | ICD-10-CM

## 2022-05-19 DIAGNOSIS — I10 Essential (primary) hypertension: Secondary | ICD-10-CM

## 2022-05-19 DIAGNOSIS — E119 Type 2 diabetes mellitus without complications: Secondary | ICD-10-CM

## 2022-05-19 LAB — POCT GLYCOSYLATED HEMOGLOBIN (HGB A1C): Hemoglobin A1C: 12.7 % — AB (ref 4.0–5.6)

## 2022-05-19 LAB — GLUCOSE, CAPILLARY: Glucose-Capillary: 250 mg/dL — ABNORMAL HIGH (ref 70–99)

## 2022-05-19 MED ORDER — METFORMIN HCL 1000 MG PO TABS: 1000.0000 mg | ORAL_TABLET | Freq: Two times a day (BID) | ORAL | 2 refills | Status: DC

## 2022-05-19 MED ORDER — INSULIN DETEMIR 100 UNIT/ML FLEXPEN
10.0000 [IU] | PEN_INJECTOR | Freq: Every day | SUBCUTANEOUS | 2 refills | Status: DC
  Filled 2022-05-19: qty 3, 30d supply, fill #0

## 2022-05-19 MED ORDER — NOVOLOG MIX 70/30 FLEXPEN (70-30) 100 UNIT/ML ~~LOC~~ SUPN: 15.0000 [IU] | PEN_INJECTOR | Freq: Two times a day (BID) | SUBCUTANEOUS | 4 refills | Status: DC

## 2022-05-19 NOTE — Assessment & Plan Note (Signed)
BP 136/63 today.  Not currently on any antihypertensives.  Asymptomatic.  This patient with poorly controlled diabetes, I would like to start an ACE inhibitor or ARB.  However this patient declines serum chemistries today.  Follow-up at next visit.

## 2022-05-19 NOTE — Assessment & Plan Note (Signed)
Hemoglobin A1c 12.7 today.  Treated with NovoLog 70/30, 10 units twice daily with meals.  Also taking metformin.  Instructions say to take 1000 mg twice daily, but the patient has 500 mg tablets, which he has only been taking 1 of twice daily.  He checks his blood glucose infrequently, citing values between 120 and 150.  Reviewed this patient's diet.  It consists of a lot of sandwiches and fast food.  I recommended adding things like vegetables and beans to each meal.  This patient with poorly controlled type 2 diabetes will benefit from tighter control of his blood glucose to prevent complications like stroke, heart attack, kidney disease.  He will need close follow-up in the near future.  I am sure that a lack of resources pose a significant barrier to effective therapy.  For now, I recommend increasing NovoLog 70/30 to 15 units twice daily with meals.  I informed him to double the dose of his metformin to 1000 mg twice daily.

## 2022-05-19 NOTE — Assessment & Plan Note (Signed)
Still has Xarelto at home, but declines to take it.  I recommended restarting it to prevent stroke.

## 2022-05-19 NOTE — Progress Notes (Signed)
Subjective:  [name] is a 56 y.o. male.  The primary encounter diagnosis was Diabetes mellitus type 2, insulin dependent ([place]). Diagnoses of Essential hypertension and Paroxysmal atrial fibrillation (HCC) were also pertinent to this visit.  Past Medical History:  Diagnosis Date   Alcohol abuse    Atrial fibrillation with RVR ([place]) 2006   spontaneous conversion to sinus rhythm   Diabetes mellitus    uncontrolled   History of tobacco use    Onychomycosis     Current Outpatient Medications on File Prior to Visit  Medication Sig Dispense Refill   atorvastatin (LIPITOR) 40 MG tablet Take 1 tablet (40 mg total) by mouth daily. 30 tablet 11   Blood Glucose Monitoring Suppl (WAVESENSE AMP) W/DEVICE KIT Use to test blood glucose 3 times per day or as needed. 1 kit 0   glucose blood (CONTOUR NEXT TEST) test strip Use to test blood sugar 2-3 times per day 100 each 5   insulin aspart protamine - aspart (NOVOLOG MIX 70/30 FLEXPEN) (70-30) 100 UNIT/ML FlexPen Inject 10 Units into the skin 2 (two) times daily with a meal. 15 mL 4   Insulin Pen Needle 32G X 4 MM MISC use daily 100 each 3   Insulin Syringe-Needle U-100 31G X 5/16" 0.5 ML MISC Use with Novolog 70/30 as instructed 2 times daily 100 each 11   Microlet Lancets MISC Use to test blood sugar 2-3 times per day 100 each 5   rivaroxaban (XARELTO) 20 MG TABS tablet Take 1 tablet (20 mg total) by mouth daily with supper. 30 tablet 11   [DISCONTINUED] liraglutide (VICTOZA) 18 MG/3ML SOPN INJECT 1.8 MG INTO THE SKIN IN THE MORNING. 9 mL 1   No current facility-administered medications on file prior to visit.    No past surgical history on file.  Family History  Problem Relation Age of Onset   Heart disease Mother 56       Myocardial infarction   Diabetes Mother    Hypertension Mother    Cancer Father     Social History   Socioeconomic History   Marital status: Legally Separated    Spouse name: Not on file   Number of  children: Not on file   Years of education: 24   Highest education level: Not on file  Occupational History   Not on file  Tobacco Use   Smoking status: Every Day    Packs/day: 0.25    Types: Cigarettes    Last attempt to quit: 04/16/2017    Years since quitting: 5.0   Smokeless tobacco: Never   Tobacco comments:    0-3 cigs per day  Substance and Sexual Activity   Alcohol use: No    Alcohol/week: 0.0 standard drinks of alcohol    Comment: Quit in 2009; now drinks ~2 cans beer per week   Drug use: No   Sexual activity: Not on file  Other Topics Concern   Not on file  Social History Narrative   Financial assistance approved for 100% discount at [facility] and has [facility] card per Bonna Gains October 3,2011 1:56PM      Dropped out of 11th grade in high school.   Used to work in [employer].   No family in [place] (2 sisters and a brother in [place])   Social Determinants of Health   Financial Resource Strain: Not on file  Food Insecurity: No Food Insecurity (05/19/2022)   Hunger Vital Sign  Worried About Charity fundraiser in the Last Year: Never true    [place] in the Last Year: Never true  Transportation Needs: No Transportation Needs (05/19/2022)   PRAPARE - Hydrologist (Medical): No    Lack of Transportation (Non-Medical): No  Physical Activity: Not on file  Stress: Not on file  Social Connections: Socially Isolated (05/19/2022)   Social Connection and Isolation Panel [NHANES]    Frequency of Communication with Friends and Family: Never    Frequency of Social Gatherings with Friends and Family: Never    Attends Religious Services: Never    Marine scientist or Organizations: No    Attends Archivist Meetings: Never    Marital Status: Married  Human resources officer Violence: Not At Risk (05/19/2022)   Humiliation, Afraid, Rape, and Kick questionnaire    Fear of Current or Ex-Partner: No    Emotionally  Abused: No    Physically Abused: No    Sexually Abused: No    Review of Systems: ROS negative except for what is noted on the assessment and plan.  Objective:   Vitals:   05/19/22 1416  BP: 136/63  Pulse: 92  Temp: 97.9 F (36.6 C)  TempSrc: Oral  SpO2: 99%  Weight: 183 lb 12.8 oz (83.4 kg)  Height: _0  (1.854 m)    Physical Exam Constitutional:      General: He is not in acute distress.    Appearance: Normal appearance.  Pulmonary:     Effort: Pulmonary effort is normal.     Breath sounds: No stridor.  Abdominal:     Tenderness: There is no abdominal tenderness.  Musculoskeletal:     Right lower leg: No edema.     Left lower leg: No edema.  Lymphadenopathy:     Cervical: No cervical adenopathy.  Skin:    General: Skin is warm and dry.  Neurological:     Mental Status: He is alert. Mental status is at baseline.  Psychiatric:        Mood and Affect: Mood normal.        Behavior: Behavior normal.       Assessment & Plan:  Paroxysmal atrial fibrillation ([place]) Still has Xarelto at home, but declines to take it.  I recommended restarting it to prevent stroke.  Essential hypertension BP 136/63 today.  Not currently on any antihypertensives.  Asymptomatic.  This patient with poorly controlled diabetes, I would like to start an ACE inhibitor or ARB.  However this patient declines serum chemistries today.  Follow-up at next visit.  Diabetes mellitus type 2, insulin dependent (HCC) Hemoglobin A1c 12.7 today.  Treated with NovoLog 70/30, 10 units twice daily with meals.  Also taking metformin.  Instructions say to take 1000 mg twice daily, but the patient has 500 mg tablets, which he has only been taking 1 of twice daily.  He checks his blood glucose infrequently, citing values between 120 and 150.  Reviewed this patient's diet.  It consists of a lot of sandwiches and fast food.  I recommended adding things like vegetables and beans to each meal.  This patient with  poorly controlled type 2 diabetes will benefit from tighter control of his blood glucose to prevent complications like stroke, heart attack, kidney disease.  He will need close follow-up in the near future.  I am sure that a lack of resources pose a significant barrier to effective therapy.  For now, I  recommend increasing NovoLog 70/30 to 15 units twice daily with meals.  I informed him to double the dose of his metformin to 1000 mg twice daily.    Return in 1 month for diabetes follow up.  Patient seen with Dr. Archie Endo MD 05/19/2022, 5:35 PM  Pager: [phone]

## 2022-05-19 NOTE — Patient Instructions (Addendum)
Today we discussed diabetes.  Start taking 15 units of insulin twice a day.  Start taking 1000 mg of metformin (two 500 mg tablets) twice a day.  Return in 1 month for diabetes follow up.   I will call you with the results of the following laboratory test(s):  Lab Orders         Glucose, capillary         POC Hbg A1C      Please call our clinic at [phone] Monday through Friday from 9 am to 4 pm if you have questions or concerns about your health. If after hours or on the weekend, call the main hospital number and ask for the Internal Medicine Resident On-Call. If you need medication refills, please notify your pharmacy one week in advance and they will send [place] a request.   Best, Marrianne Mood, MD [facility]

## 2022-05-20 NOTE — Progress Notes (Signed)
Internal Medicine Clinic Attending  I saw and evaluated the patient.  I personally confirmed the key portions of the history and exam documented by the resident  and I reviewed pertinent patient test results.  The assessment, diagnosis, and plan were formulated together and I agree with the documentation in the resident's note.  

## 2022-06-01 ENCOUNTER — Other Ambulatory Visit (HOSPITAL_COMMUNITY): Payer: Self-pay

## 2022-06-21 ENCOUNTER — Other Ambulatory Visit: Payer: Self-pay | Admitting: Student

## 2022-06-21 ENCOUNTER — Other Ambulatory Visit (HOSPITAL_COMMUNITY): Payer: Self-pay

## 2022-06-21 DIAGNOSIS — E119 Type 2 diabetes mellitus without complications: Secondary | ICD-10-CM

## 2022-06-21 MED ORDER — METFORMIN HCL 500 MG PO TABS
1000.0000 mg | ORAL_TABLET | Freq: Two times a day (BID) | ORAL | 11 refills | Status: DC
  Filled 2022-06-21: qty 120, 30d supply, fill #0
  Filled 2022-08-06: qty 120, 30d supply, fill #1
  Filled 2022-09-09: qty 120, 30d supply, fill #2
  Filled 2022-10-20: qty 120, 30d supply, fill #3
  Filled 2022-12-06: qty 120, 30d supply, fill #4
  Filled 2023-01-13: qty 120, 30d supply, fill #5
  Filled 2023-02-22: qty 120, 30d supply, fill #6
  Filled 2023-04-04: qty 120, 30d supply, fill #7
  Filled 2023-05-09: qty 120, 30d supply, fill #8

## 2022-06-21 MED ORDER — TECHLITE PEN NEEDLES 32G X 4 MM MISC
1.0000 "application " | Freq: Every day | 3 refills | Status: DC
  Filled 2022-06-21: qty 100, 90d supply, fill #0

## 2022-06-22 ENCOUNTER — Other Ambulatory Visit (HOSPITAL_COMMUNITY): Payer: Self-pay

## 2022-06-22 ENCOUNTER — Encounter: Payer: Self-pay | Admitting: Student

## 2022-07-12 ENCOUNTER — Telehealth: Payer: Self-pay

## 2022-07-12 ENCOUNTER — Other Ambulatory Visit (HOSPITAL_COMMUNITY): Payer: Self-pay

## 2022-07-12 DIAGNOSIS — E119 Type 2 diabetes mellitus without complications: Secondary | ICD-10-CM

## 2022-07-12 MED ORDER — "INSULIN SYRINGE-NEEDLE U-100 31G X 5/16"" 0.5 ML MISC"
1.0000 | Freq: Two times a day (BID) | 11 refills | Status: AC
  Filled 2022-07-12: qty 100, 30d supply, fill #0

## 2022-07-12 MED ORDER — NOVOLOG MIX 70/30 FLEXPEN (70-30) 100 UNIT/ML ~~LOC~~ SUPN
15.0000 [IU] | PEN_INJECTOR | Freq: Two times a day (BID) | SUBCUTANEOUS | 4 refills | Status: AC
  Filled 2022-07-12: qty 9, 30d supply, fill #0
  Filled 2022-08-06: qty 9, 30d supply, fill #1
  Filled 2022-09-09: qty 9, 30d supply, fill #2

## 2022-07-12 MED ORDER — TECHLITE PEN NEEDLES 32G X 4 MM MISC
1.0000 "application " | Freq: Every day | 3 refills | Status: DC
  Filled 2022-07-12: qty 100, 50d supply, fill #0
  Filled 2022-09-09: qty 100, 50d supply, fill #1
  Filled 2023-01-13 (×2): qty 100, 50d supply, fill #2
  Filled 2023-02-22: qty 100, 50d supply, fill #3

## 2022-07-12 NOTE — Telephone Encounter (Signed)
Pt returned call.  Informed [employer] will need copy of most recent 1040. Pt says he will look for this and bring it into the office asap.   Pt also was unaware he needed to re-enroll with novo nordisk for his novolog mix 70/30 flexpens. He has 2 pens left. Informed patient I could re-enroll him online. In the meantime patient would like a refill of these pens sent to [facility] outpatient pharmacy and he will pay for them out of pocket.

## 2022-07-12 NOTE — Telephone Encounter (Signed)
Pt returned call

## 2022-07-12 NOTE — Addendum Note (Signed)
Addended by: Virl Axe on: 07/12/2022 01:29 PM   Modules accepted: Orders

## 2022-07-12 NOTE — Telephone Encounter (Signed)
Rec'd patients signed re-enrollment application for Xarelto ([employer] - [employer]).   Will need a copy of recent 1040 if patient filed taxes.  Left message with Tomi Bamberger requesting call back from patient.

## 2022-07-13 ENCOUNTER — Other Ambulatory Visit (HOSPITAL_COMMUNITY): Payer: Self-pay

## 2022-07-14 NOTE — Telephone Encounter (Signed)
Submitted RE-ENROLLMENT application for XARELTO to [employer]etta Emery [employer] Wynetta Emery) for patient assistance.   Phone: [phone]

## 2022-07-15 ENCOUNTER — Telehealth: Payer: Self-pay

## 2022-07-15 NOTE — Telephone Encounter (Signed)
Submitted application for NOVOLOG 70/30 FLEXPEN to [place] for patient assistance.   Submitted online via portal  Phone: [phone]

## 2022-07-19 NOTE — Telephone Encounter (Signed)
Received notification from [place] Wynetta Emery [employer] Wynetta Emery) regarding approval for XARELTO. Patient assistance approved from 07/16/22 to 07/17/23.    Phone: [phone]

## 2022-07-21 NOTE — Telephone Encounter (Signed)
Received notification from [place] regarding approval for NOVOLOG MIX 70/30 FLEXPEN. Patient assistance approved from 07/20/22 to 07/14/23.  MEDICATION WILL SHIP TO OFFICE  Phone: [phone]

## 2022-08-06 ENCOUNTER — Other Ambulatory Visit (HOSPITAL_COMMUNITY): Payer: Self-pay

## 2022-08-06 ENCOUNTER — Telehealth: Payer: Self-pay | Admitting: *Deleted

## 2022-08-06 NOTE — Telephone Encounter (Signed)
Patient came to clinic to see if his novolog 70/30 flexpen had arrived from patient assistance. Did not see see this in fridge. Patient has refill under IM Program at [facility] for this and metformin. Spoke with Terry Castillo at [facility]. She will get these ready  for patient now.

## 2022-08-06 NOTE — Telephone Encounter (Signed)
Left message informing patient that his enrollment with novo nordisk was approved and we are awaiting his shipment here at the office. Also informed that medication was called into outpatient pharmacy in the meantime.

## 2022-09-09 ENCOUNTER — Other Ambulatory Visit (HOSPITAL_COMMUNITY): Payer: Self-pay

## 2022-09-15 ENCOUNTER — Telehealth: Payer: Self-pay

## 2022-09-15 NOTE — Telephone Encounter (Signed)
Left message informing patient his novo nordisk shipment is ready for pickup.  3 boxes of novolog mix 70/30 pens are labeled and ready for pickup.

## 2022-09-29 ENCOUNTER — Encounter: Payer: Self-pay | Admitting: Student

## 2022-10-20 ENCOUNTER — Other Ambulatory Visit (HOSPITAL_COMMUNITY): Payer: Self-pay

## 2022-11-01 ENCOUNTER — Encounter: Payer: Self-pay | Admitting: *Deleted

## 2022-11-30 ENCOUNTER — Telehealth: Payer: Self-pay | Admitting: *Deleted

## 2022-11-30 NOTE — Telephone Encounter (Addendum)
Received shipment from [employer] pt assistance program Novolog Mix 70/30 Flexpen prefilled pens  5x25ml pens per box  3 Boxes total  .Terry Spittle Cassady7/2/20243:55 PM  [facility] [phone]

## 2022-12-01 NOTE — Telephone Encounter (Signed)
Left message informing patient his novo nordisk shipment arrived and is ready for pickup.

## 2022-12-06 ENCOUNTER — Other Ambulatory Visit (HOSPITAL_COMMUNITY): Payer: Self-pay

## 2022-12-06 NOTE — Telephone Encounter (Signed)
Pt here to pick up samples Medication and instructions confirmed by CMA and verbalized by pt No further action needed  Phone call complete.Kingsley Spittle Cassady7/8/202412:35 PM

## 2023-01-13 ENCOUNTER — Other Ambulatory Visit (HOSPITAL_COMMUNITY): Payer: Self-pay

## 2023-02-22 ENCOUNTER — Other Ambulatory Visit (HOSPITAL_COMMUNITY): Payer: Self-pay

## 2023-04-04 ENCOUNTER — Other Ambulatory Visit (HOSPITAL_COMMUNITY): Payer: Self-pay

## 2023-05-09 ENCOUNTER — Other Ambulatory Visit: Payer: Self-pay | Admitting: Student

## 2023-05-09 ENCOUNTER — Other Ambulatory Visit (HOSPITAL_COMMUNITY): Payer: Self-pay

## 2023-05-09 DIAGNOSIS — E119 Type 2 diabetes mellitus without complications: Secondary | ICD-10-CM

## 2023-05-09 MED ORDER — TECHLITE PLUS PEN NEEDLES 32G X 4 MM MISC
1.0000 "application " | Freq: Every day | 3 refills | Status: AC
  Filled 2023-05-09 – 2023-07-22 (×2): qty 100, 50d supply, fill #0
  Filled 2023-09-06: qty 100, 50d supply, fill #1
  Filled 2023-10-17: qty 100, 50d supply, fill #2
  Filled 2023-12-27: qty 100, 50d supply, fill #3

## 2023-05-18 ENCOUNTER — Telehealth: Payer: Self-pay

## 2023-05-18 NOTE — Telephone Encounter (Signed)
Informed patient his novo nordisk med shipment is ready for pickup.   3 boxes of novolog mix 70/30 pens are labeled in med room fridge.

## 2023-05-19 ENCOUNTER — Other Ambulatory Visit (HOSPITAL_COMMUNITY): Payer: Self-pay

## 2023-05-20 NOTE — Telephone Encounter (Signed)
Patient given 3 samples of the Novolog mix 70/30 Pens.

## 2023-07-13 ENCOUNTER — Other Ambulatory Visit (HOSPITAL_COMMUNITY): Payer: Self-pay

## 2023-07-18 ENCOUNTER — Telehealth: Payer: Self-pay

## 2023-07-18 ENCOUNTER — Other Ambulatory Visit (HOSPITAL_COMMUNITY): Payer: Self-pay

## 2023-07-18 NOTE — Progress Notes (Signed)
 Pharmacy Medication Assistance Program Note    08/15/2023  Patient ID: Terry Castillo, male   DOB: 09/30/1965, 58 y.o.   MRN: [id number]     07/18/2023  Outreach Medication One  Manufacturer Medication One [employer] [facility] Novolog 70/30  Type of Radiographer, therapeutic Assistance  Name of Prescriber JULIE MACHEN  Date Application Received From Provider 08/10/2023  Date Application Submitted to Manufacturer 07/18/2023  Method Application Sent to Manufacturer Online  Patient Assistance Determination Approved  Approval Start Date 08/12/2023  Approval End Date 08/05/2024    Renewal approved.

## 2023-07-22 ENCOUNTER — Other Ambulatory Visit: Payer: Self-pay | Admitting: Student

## 2023-07-22 ENCOUNTER — Other Ambulatory Visit (HOSPITAL_COMMUNITY): Payer: Self-pay

## 2023-07-22 DIAGNOSIS — Z794 Long term (current) use of insulin: Secondary | ICD-10-CM

## 2023-07-25 ENCOUNTER — Other Ambulatory Visit (HOSPITAL_COMMUNITY): Payer: Self-pay

## 2023-07-25 MED ORDER — METFORMIN HCL 500 MG PO TABS
1000.0000 mg | ORAL_TABLET | Freq: Two times a day (BID) | ORAL | 11 refills | Status: DC
Start: 2023-07-25 — End: 2024-02-07
  Filled 2023-07-25 (×2): qty 120, 30d supply, fill #0
  Filled 2023-09-06: qty 120, 30d supply, fill #1
  Filled 2023-10-17: qty 120, 30d supply, fill #2
  Filled 2023-11-28 (×3): qty 120, 30d supply, fill #3
  Filled 2023-12-27: qty 120, 30d supply, fill #4

## 2023-08-01 ENCOUNTER — Encounter: Payer: Self-pay | Admitting: Student

## 2023-08-01 ENCOUNTER — Ambulatory Visit: Payer: Self-pay | Admitting: Student

## 2023-08-01 ENCOUNTER — Other Ambulatory Visit (HOSPITAL_COMMUNITY): Payer: Self-pay

## 2023-08-01 VITALS — BP 133/76 | HR 82 | Temp 98.2°F | Ht 73.0 in | Wt 187.6 lb

## 2023-08-01 DIAGNOSIS — I10 Essential (primary) hypertension: Secondary | ICD-10-CM

## 2023-08-01 DIAGNOSIS — I480 Paroxysmal atrial fibrillation: Secondary | ICD-10-CM

## 2023-08-01 DIAGNOSIS — E119 Type 2 diabetes mellitus without complications: Secondary | ICD-10-CM

## 2023-08-01 DIAGNOSIS — Z1211 Encounter for screening for malignant neoplasm of colon: Secondary | ICD-10-CM

## 2023-08-01 DIAGNOSIS — Z7984 Long term (current) use of oral hypoglycemic drugs: Secondary | ICD-10-CM

## 2023-08-01 DIAGNOSIS — E785 Hyperlipidemia, unspecified: Secondary | ICD-10-CM

## 2023-08-01 DIAGNOSIS — Z794 Long term (current) use of insulin: Secondary | ICD-10-CM

## 2023-08-01 LAB — GLUCOSE, CAPILLARY: Glucose-Capillary: 230 mg/dL — ABNORMAL HIGH (ref 70–99)

## 2023-08-01 LAB — POCT GLYCOSYLATED HEMOGLOBIN (HGB A1C): Hemoglobin A1C: 10.6 % — AB (ref 4.0–5.6)

## 2023-08-01 MED ORDER — ASPIRIN 81 MG PO TBEC
81.0000 mg | DELAYED_RELEASE_TABLET | Freq: Every day | ORAL | 2 refills | Status: AC
  Filled 2023-08-01 – 2023-09-06 (×2): qty 150, 150d supply, fill #0
  Filled 2023-12-27: qty 150, 150d supply, fill #1

## 2023-08-01 NOTE — Assessment & Plan Note (Addendum)
 Due for colonoscopy, but does not have any insurance.  Recommended he have this done once he has insurance coverage.

## 2023-08-01 NOTE — Assessment & Plan Note (Addendum)
 BP Readings from Last 3 Encounters:  08/01/23 133/76  05/19/22 136/63  07/15/21 136/74    Blood pressure well-controlled today.  Not on any medications currently.  Asymptomatic.  No medications indicated at this time.  In the future if he develops hypertension, he may benefit from an ACE/ARB given his diabetes.

## 2023-08-01 NOTE — Progress Notes (Signed)
    CC: Routine Follow Up for HTN, T2DM after last office visit 04/2022  HPI:  Terry Castillo is a 58 y.o. male with pertinent PMH of HTN, T2DM, pAfib who presents to the clinic for routine follow up. Please see assessment and plan below for further details.  Review of Systems:   Pertinent items noted in HPI and/or A&P.  Physical Exam:  Vitals:   08/01/23 0838 08/01/23 0856  BP: (!) 150/73 133/76  Pulse: 82 82  Temp: 98.2 F (36.8 C)   TempSrc: Oral   SpO2: 100%   Weight: 187 lb 9.6 oz (85.1 kg)   Height: 6\' 1"  (1.854 m)    Well-appearing male in no acute distress Heart rate regular, no murmurs, euvolemic Lungs clear to auscultation bilaterally, normal respiratory effort No focal musculoskeletal or neurological deficits  Assessment & Plan:   Essential hypertension BP Readings from Last 3 Encounters:  08/01/23 133/76  05/19/22 136/63  07/15/21 136/74    Blood pressure well-controlled today.  Not on any medications currently.  Asymptomatic.  No medications indicated at this time.  In the future if he develops hypertension, he may benefit from an ACE/ARB given his diabetes.  Diabetes mellitus type 2, insulin dependent (HCC)  Lab Results  Component Value Date   HGBA1C 10.6 (A) 08/01/2023   HGBA1C 12.7 (A) 05/19/2022   HGBA1C 10.7 (A) 07/15/2021   Currently managing his diabetes with metformin 2000 mg daily and NovoLog 70/30, 15 units twice daily with meals.  Financial assistance application is pending for his insulin.  He has plenty of refills at this time. He has not been checking his blood sugars regularly.  Denies any vision changes, polyuria, polydipsia, or polyneuropathy. Last eye exam was in 2022, will need another once he has insurance.   He is currently self-pay and finances are a barrier to treatment options, so I have given him information applying to Medicaid and to our financial assistance program. Urine microalbumin/creatinine costs $130 out-of-pocket, so we  will hold off on this for now.    His hemoglobin A1c is improved from 2023, but still quite elevated.  Will increase his NovoLog to 17 units twice daily with meals and will follow-up in 3 months.  Screening for colon cancer Due for colonoscopy, but does not have any insurance.  Recommended he have this done once he has insurance coverage.  Hyperlipidemia History of hyperlipidemia in the setting of type 2 diabetes.  On Lipitor 40 mg daily with no concerns.  Will continue this medication.  Paroxysmal atrial fibrillation (HCC) History of paroxysmal A-fib.  Has not been taking Xarelto at home.  CHA2DS2-VASc is 1-2.  No new palpitations, lightheadedness or dizziness.  Cannot afford Xarelto at this time, so I will prescribe aspirin 81 mg daily.   Patient discussed with Dr. Jonita Albee, MD [facility] Internal Medicine Resident PGY-1 Clinic Phone: [phone] Pager: [phone]

## 2023-08-01 NOTE — Patient Instructions (Addendum)
 Thank you, Mr.Isaias A Whetstone for allowing [place] to provide your care today.  Your blood pressure was normal today at 133/76.  We rechecked your hemoglobin A1c, which is a measure of your blood sugars over the past 3 months.  I will call you as soon as I have the results.  We may need to adjust your diabetes medications at that time.  For now, continue taking your metformin 2000 mg daily (1 tablet twice a day) and using your NovoLog insulin twice a day with meals, 15 units.  I have prescribed aspirin 81 mg daily to reduce your risk of heart attack and stroke given your previous atrial fibrillation, since the Xarelto is expensive.   I recommend getting a colonoscopy, eye exam, and checking a urine microalbumin/creatinine to assess your kidney function, but we will hold off for now until you are able to get insurance coverage.  I have ordered the following labs for you:  Lab Orders         Glucose, capillary         POC Hbg A1C      Follow up: 3 months   We look forward to seeing you next time. Please call our clinic at [phone] if you have any questions or concerns. The best time to call is Monday-Friday from 9am-4pm, but there is someone available 24/7. If after hours or the weekend, call the main hospital number and ask for the Internal Medicine Resident On-Call. If you need medication refills, please notify your pharmacy one week in advance and they will send [place] a request.   Thank you for trusting me with your care. Wishing you the best!   Annett Fabian, MD [facility]

## 2023-08-01 NOTE — Assessment & Plan Note (Addendum)
 History of paroxysmal A-fib.  Has not been taking Xarelto at home.  CHA2DS2-VASc is 1-2.  No new palpitations, lightheadedness or dizziness.  Cannot afford Xarelto at this time, so I will prescribe aspirin 81 mg daily.

## 2023-08-01 NOTE — Assessment & Plan Note (Addendum)
  Lab Results  Component Value Date   HGBA1C 10.6 (A) 08/01/2023   HGBA1C 12.7 (A) 05/19/2022   HGBA1C 10.7 (A) 07/15/2021   Currently managing his diabetes with metformin 2000 mg daily and NovoLog 70/30, 15 units twice daily with meals.  Financial assistance application is pending for his insulin.  He has plenty of refills at this time. He has not been checking his blood sugars regularly.  Denies any vision changes, polyuria, polydipsia, or polyneuropathy. Last eye exam was in 2022, will need another once he has insurance.   He is currently self-pay and finances are a barrier to treatment options, so I have given him information applying to Medicaid and to our financial assistance program. Urine microalbumin/creatinine costs $130 out-of-pocket, so we will hold off on this for now.    His hemoglobin A1c is improved from 2023, but still quite elevated.  Will increase his NovoLog to 17 units twice daily with meals and will follow-up in 3 months.

## 2023-08-01 NOTE — Assessment & Plan Note (Signed)
 History of hyperlipidemia in the setting of type 2 diabetes.  On Lipitor 40 mg daily with no concerns.  Will continue this medication.

## 2023-08-08 NOTE — Progress Notes (Signed)
 Internal Medicine Clinic Attending  Case discussed with the resident at the time of the visit.  We reviewed the resident's history and exam and pertinent patient test results.  I agree with the assessment, diagnosis, and plan of care documented in the resident's note.

## 2023-08-11 ENCOUNTER — Other Ambulatory Visit (HOSPITAL_COMMUNITY): Payer: Self-pay

## 2023-09-06 ENCOUNTER — Other Ambulatory Visit (HOSPITAL_COMMUNITY): Payer: Self-pay

## 2023-09-15 ENCOUNTER — Telehealth: Payer: Self-pay

## 2023-09-15 NOTE — Telephone Encounter (Signed)
 Left message informing patient his novo nordisk shipment is ready for pickup.   3 boxes of novolog mix 70/30 pen (4 month supply)

## 2023-09-21 NOTE — Telephone Encounter (Signed)
 Patient picked up medication

## 2023-10-17 ENCOUNTER — Other Ambulatory Visit (HOSPITAL_COMMUNITY): Payer: Self-pay

## 2023-11-02 ENCOUNTER — Telehealth: Payer: Self-pay

## 2023-11-02 NOTE — Telephone Encounter (Signed)
 In process of completing address change/refill request form for patients assistance with Novo Nordisk. Shipments take 10-14 business days to arrive at office.   Refills for:   Novolog  Mix 70/30 - 15 units BID   Form in Dr. Zacarias Hermann box awaiting signature.

## 2023-11-04 ENCOUNTER — Encounter: Payer: Self-pay | Admitting: *Deleted

## 2023-11-16 NOTE — Telephone Encounter (Signed)
 Signed form completed and faxed to novo nordisk.

## 2023-11-28 ENCOUNTER — Other Ambulatory Visit (HOSPITAL_COMMUNITY): Payer: Self-pay

## 2023-11-28 ENCOUNTER — Other Ambulatory Visit: Payer: Self-pay | Admitting: Student

## 2023-11-29 ENCOUNTER — Other Ambulatory Visit (HOSPITAL_COMMUNITY): Payer: Self-pay

## 2023-11-29 ENCOUNTER — Other Ambulatory Visit: Payer: Self-pay

## 2023-11-29 MED ORDER — UNIFINE PENTIPS 32G X 4 MM MISC
0 refills | Status: AC
  Filled 2023-11-29 – 2024-02-07 (×2): qty 100, 50d supply, fill #0

## 2023-12-08 ENCOUNTER — Other Ambulatory Visit (HOSPITAL_COMMUNITY): Payer: Self-pay

## 2023-12-27 ENCOUNTER — Other Ambulatory Visit (HOSPITAL_COMMUNITY): Payer: Self-pay

## 2023-12-28 NOTE — Telephone Encounter (Signed)
 Rec'd 3 boxes Novolog  Mix 70/30 Flexpens - 5 month supply

## 2023-12-29 NOTE — Telephone Encounter (Signed)
 Informed patient his insulin  order is ready for pickup.

## 2024-01-02 NOTE — Telephone Encounter (Signed)
 Pt picked up 3 boxes of Novolog  Flexpens.

## 2024-02-07 ENCOUNTER — Encounter: Payer: Self-pay | Admitting: Student

## 2024-02-07 ENCOUNTER — Ambulatory Visit (INDEPENDENT_AMBULATORY_CARE_PROVIDER_SITE_OTHER): Payer: Self-pay | Admitting: Student

## 2024-02-07 ENCOUNTER — Other Ambulatory Visit: Payer: Self-pay

## 2024-02-07 ENCOUNTER — Other Ambulatory Visit (HOSPITAL_COMMUNITY): Payer: Self-pay

## 2024-02-07 VITALS — BP 138/83 | HR 79 | Temp 98.2°F | Ht 73.0 in

## 2024-02-07 DIAGNOSIS — E119 Type 2 diabetes mellitus without complications: Secondary | ICD-10-CM

## 2024-02-07 DIAGNOSIS — Z7984 Long term (current) use of oral hypoglycemic drugs: Secondary | ICD-10-CM

## 2024-02-07 DIAGNOSIS — I10 Essential (primary) hypertension: Secondary | ICD-10-CM

## 2024-02-07 DIAGNOSIS — E785 Hyperlipidemia, unspecified: Secondary | ICD-10-CM

## 2024-02-07 DIAGNOSIS — F17201 Nicotine dependence, unspecified, in remission: Secondary | ICD-10-CM

## 2024-02-07 DIAGNOSIS — I480 Paroxysmal atrial fibrillation: Secondary | ICD-10-CM

## 2024-02-07 DIAGNOSIS — Z794 Long term (current) use of insulin: Secondary | ICD-10-CM

## 2024-02-07 LAB — POCT URINALYSIS DIPSTICK
Bilirubin, UA: NEGATIVE
Blood, UA: NEGATIVE
Glucose, UA: POSITIVE — AB
Ketones, UA: NEGATIVE
Leukocytes, UA: NEGATIVE
Nitrite, UA: NEGATIVE
Protein, UA: NEGATIVE
Spec Grav, UA: 1.02 (ref 1.010–1.025)
Urobilinogen, UA: 0.2 U/dL
pH, UA: 6 (ref 5.0–8.0)

## 2024-02-07 LAB — GLUCOSE, CAPILLARY: Glucose-Capillary: 234 mg/dL — ABNORMAL HIGH (ref 70–99)

## 2024-02-07 LAB — POCT GLYCOSYLATED HEMOGLOBIN (HGB A1C): HbA1c, POC (controlled diabetic range): 9.3 % — AB (ref 0.0–7.0)

## 2024-02-07 MED ORDER — TRULICITY 0.75 MG/0.5ML ~~LOC~~ SOAJ
0.7500 mg | SUBCUTANEOUS | 11 refills | Status: AC
  Filled 2024-02-07: qty 2, 28d supply, fill #0
  Filled 2024-03-06: qty 2, 28d supply, fill #1
  Filled 2024-04-12: qty 2, 28d supply, fill #2
  Filled 2024-05-21: qty 2, 28d supply, fill #3
  Filled 2024-06-18: qty 2, 28d supply, fill #4

## 2024-02-07 MED ORDER — METFORMIN HCL 500 MG PO TABS
1000.0000 mg | ORAL_TABLET | Freq: Two times a day (BID) | ORAL | 11 refills | Status: AC
  Filled 2024-02-07: qty 120, 30d supply, fill #0
  Filled 2024-03-06: qty 360, 90d supply, fill #1
  Filled 2024-06-18: qty 360, 90d supply, fill #2

## 2024-02-07 MED ORDER — ATORVASTATIN CALCIUM 20 MG PO TABS
20.0000 mg | ORAL_TABLET | Freq: Every day | ORAL | 11 refills | Status: AC
  Filled 2024-02-07: qty 30, 30d supply, fill #0
  Filled 2024-03-06: qty 90, 90d supply, fill #1
  Filled 2024-06-18: qty 90, 90d supply, fill #2

## 2024-02-07 MED ORDER — VALSARTAN 40 MG PO TABS
40.0000 mg | ORAL_TABLET | Freq: Every day | ORAL | 11 refills | Status: AC
  Filled 2024-02-07: qty 30, 30d supply, fill #0
  Filled 2024-03-06: qty 90, 90d supply, fill #1
  Filled 2024-06-18: qty 90, 90d supply, fill #2

## 2024-02-07 NOTE — Progress Notes (Signed)
 Subjective:  CC: Chronic condition follow-up  HPI:  Mr.Tashaun A Earnhart is a 58 y.o. male with a past medical history stated below and presents today for diabetes, hypertension, atrial fibrillation, medication management. Please see problem based assessment and plan for additional details.  Past Medical History:  Diagnosis Date   Alcohol abuse    Atrial fibrillation with RVR (HCC) 2006   spontaneous conversion to sinus rhythm   Diabetes mellitus    uncontrolled   History of tobacco use    Onychomycosis     Current Outpatient Medications on File Prior to Visit  Medication Sig Dispense Refill   aspirin  EC 81 MG tablet Take 1 tablet (81 mg total) by mouth daily. Swallow whole. 150 tablet 2   Blood Glucose Monitoring Suppl (WAVESENSE AMP) W/DEVICE KIT Use to test blood glucose 3 times per day or as needed. 1 kit 0   glucose blood (CONTOUR NEXT TEST) test strip Use to test blood sugar 2-3 times per day 100 each 5   insulin  aspart protamine - aspart (NOVOLOG  MIX 70/30 FLEXPEN) (70-30) 100 UNIT/ML FlexPen Inject 15 Units into the skin 2 (two) times daily with a meal. 15 mL 4   Insulin  Pen Needle (TECHLITE PLUS PEN NEEDLES) 32G X 4 MM MISC Use daily. 100 each 3   Insulin  Pen Needle (UNIFINE PENTIPS) 32G X 4 MM MISC Use as directed with insulin  and Victoza  100 each 0   Insulin  Syringe-Needle U-100 31G X 5/16 0.5 ML MISC Use with Novolog  70/30 as instructed 2 times daily 100 each 11   Microlet Lancets MISC Use to test blood sugar 2-3 times per day 100 each 5   rivaroxaban  (XARELTO ) 20 MG TABS tablet Take 1 tablet (20 mg total) by mouth daily with supper. 30 tablet 11   [DISCONTINUED] liraglutide  (VICTOZA ) 18 MG/3ML SOPN INJECT 1.8 MG INTO THE SKIN IN THE MORNING. 9 mL 1   No current facility-administered medications on file prior to visit.    Family History  Problem Relation Age of Onset   Heart disease Mother 22       Myocardial infarction   Diabetes Mother    Hypertension Mother     Cancer Father     Social History   Socioeconomic History   Marital status: Legally Separated    Spouse name: Not on file   Number of children: Not on file   Years of education: 11   Highest education level: Not on file  Occupational History  Tobacco Use   Smoking status: Every Day    Current packs/day: 0.00    Types: Cigarettes    Last attempt to quit: 04/16/2017    Years since quitting: 6.8   Smokeless tobacco: Never   Tobacco comments:    0-3 cigs per day  Substance and Sexual Activity   Alcohol use: No    Alcohol/week: 0.0 standard drinks of alcohol    Comment: Quit in 2009; now drinks ~2 cans beer per week   Drug use: No   Sexual activity: Not on file  Other Topics Concern   Not on file  Social History Narrative   Financial assistance approved for 100% discount at [facility] and has [facility] card per Barnie Potters October 3,2011 1:56PM      Dropped out of 11th grade in high school.   Used to work in [employer].   No family in [place] (2 sisters and a brother in [place]. [place])   Social Drivers of  Health   Financial Resource Strain: Not on file  Food Insecurity: No Food Insecurity (02/07/2024)   Hunger Vital Sign    Worried About Running Out of Food in the Last Year: Never true    Ran Out of Food in the Last Year: Never true  Transportation Needs: No Transportation Needs (02/07/2024)   PRAPARE - Administrator, Civil Service (Medical): No    Lack of Transportation (Non-Medical): No  Physical Activity: Not on file  Stress: Not on file  Social Connections: Socially Isolated (05/19/2022)   Social Connection and Isolation Panel    Frequency of Communication with Friends and Family: Never    Frequency of Social Gatherings with Friends and Family: Never    Attends Religious Services: Never    Database administrator or Organizations: No    Attends Banker Meetings: Never    Marital Status: Married  Catering manager Violence: Not At Risk  (02/07/2024)   Humiliation, Afraid, Rape, and Kick questionnaire    Fear of Current or Ex-Partner: No    Emotionally Abused: No    Physically Abused: No    Sexually Abused: No    Review of Systems: ROS negative except for what is noted on the assessment and plan.  Objective:   Vitals:   02/07/24 0915  BP: 138/83  Pulse: 79  Temp: 98.2 F (36.8 C)  TempSrc: Oral  SpO2: 96%  Height: 6' 1 (1.854 m)    Physical Exam: Constitutional: well-appearing man sitting in exam chair, in no acute distress HENT: normocephalic atraumatic, mucous membranes moist Eyes: conjunctiva non-erythematous Neck: supple Cardiovascular: regular rate and rhythm Pulmonary/Chest: normal work of breathing on room air, lungs clear to auscultation bilaterally Abdominal: soft, non-tender, non-distended MSK: normal bulk and tone Neurological: alert & oriented x 3, Skin: warm and dry; mild hyperpigmentation on hands and feet.  No lower extremity edema.  No open ulcers or hyperkeratotic nails.  No overt wounds or callus. normal monofilament exam Psych: Pleasant mood and affect  Assessment & Plan:   Diabetes mellitus type 2, insulin  dependent (HCC) Lab Results  Component Value Date   HGBA1C 9.3 (A) 02/07/2024   HGBA1C 10.6 (A) 08/01/2023   HGBA1C 12.7 (A) 05/19/2022   Patient is currently on insulin  70/30 15 units twice daily.  As of June of this year, he is still is getting insulin  70/30.  He is also taking metformin  1000 twice daily without side effects.  He is not taking a statin currently.  He is not on an ARB.  Discussed dietary and movement practices to help with his diabetes. No proteinuria on dipstick, but discussed the limitations for this tests and microalbuminuria.   - Foot exam today; no abnormalities - He   he has improved small but we still need to better control T2DM Plan -Increase 70/30 to 17 units twice daily -Start dulaglutide  0.75 mg weekly; GLP-1 initiation side effects and  precautions -Return to clinic in 4 weeks with glucometer, which patient left at home today.  Uptitration of medications as needed - Restarted on statin today -ARB for hypertension added today -Follow BMP  Near future plan: - Messaged regarding ideation of Jardiance to patient's regimen -I do not expect medication to add to his home in the next 4 weeks, but told him to not take any new medications other than the ones prescribed today until he sees a doctor here in the clinic for further instructions  Essential hypertension Persistently elevated at prior visits  and today. - Started on valsartan  40 mg daily through [facility] at the [facility] pharmacy -BMP today for renal function and K check - Continue monitoring   Tobacco use disorder, mild, in sustained remission Sustained remission  Paroxysmal atrial fibrillation (HCC) CHA?DS?-VASc score of 2 for hypertension and diabetes.  Substandard therapy with aspirin  given lack of insurance and coverage for DOAC at this time.  Will need to discuss warfarin with him, not done at this visit.  Plan: - Will inquire about Xarelto  assistance programs from [place] - For now, patient is to continue Aspirin   Hyperlipidemia Restarted on Lipitor 20 mg daily    Return in about 4 weeks (around 03/06/2024) for Diabetes, GLP-1 follow-up, glucometer follow-up, Xarelto  discussion.  Patient discussed with Dr. Lovie Hadassah Kristy Rosario, MD [facility] Program  02/07/2024, 2:05 PM

## 2024-02-07 NOTE — Progress Notes (Signed)
 Internal Medicine Clinic Attending  Case discussed with the resident at the time of the visit.  We reviewed the resident's history and exam and pertinent patient test results.  I agree with the assessment, diagnosis, and plan of care documented in the resident's note.

## 2024-02-07 NOTE — Assessment & Plan Note (Signed)
 CHA?DS?-VASc score of 2 for hypertension and diabetes.  Substandard therapy with aspirin  given lack of insurance and coverage for DOAC at this time.  Will need to discuss warfarin with him, not done at this visit.  Plan: - Will inquire about Xarelto  assistance programs from [place] - For now, patient is to continue Aspirin 

## 2024-02-07 NOTE — Assessment & Plan Note (Addendum)
 Persistently elevated at prior visits and today. - Started on valsartan  40 mg daily through [facility] at the [facility] pharmacy -BMP today for renal function and K check - Continue monitoring

## 2024-02-07 NOTE — Assessment & Plan Note (Addendum)
 Lab Results  Component Value Date   HGBA1C 9.3 (A) 02/07/2024   HGBA1C 10.6 (A) 08/01/2023   HGBA1C 12.7 (A) 05/19/2022   Patient is currently on insulin  70/30 15 units twice daily.  As of June of this year, he is still is getting insulin  70/30.  He is also taking metformin  1000 twice daily without side effects.  He is not taking a statin currently.  He is not on an ARB.  Discussed dietary and movement practices to help with his diabetes. No proteinuria on dipstick, but discussed the limitations for this tests and microalbuminuria.   - Foot exam today; no abnormalities - He   he has improved small but we still need to better control T2DM Plan -Increase 70/30 to 17 units twice daily -Start dulaglutide  0.75 mg weekly; GLP-1 initiation side effects and precautions -Return to clinic in 4 weeks with glucometer, which patient left at home today.  Uptitration of medications as needed - Restarted on statin today -ARB for hypertension added today -Follow BMP  Near future plan: - Messaged regarding ideation of Jardiance to patient's regimen -I do not expect medication to add to his home in the next 4 weeks, but told him to not take any new medications other than the ones prescribed today until he sees a doctor here in the clinic for further instructions

## 2024-02-07 NOTE — Assessment & Plan Note (Signed)
Sustained remission 

## 2024-02-07 NOTE — Assessment & Plan Note (Signed)
 Restarted on Lipitor 20 mg daily

## 2024-02-07 NOTE — Patient Instructions (Addendum)
 Terry Castillo f   Today we discussed a lot of things   DIABETES - Please increase the number of units to your use of insulin .  Increase your insulin  to 17 units in the morning and 17 units at night  -I have also ordered a medication called Trulicity : This is an injection once a week.  It will help with your diabetes and also with weight maintenance.  It protects your heart   You should inject 0.75 mg weekly. Please stay hydrated and eat smaller meals while you are on this medication. Monitor yourself for signs of: -Nausea, vomiting, bloating, diarrhea, or abdominal discomfort.  -Most of the effects should get better as you continue using this medication -If it does not improve in 2-3 days or you have poor oral intake, experience lightheadedness, or feel sick, please STOP the medication and call our office.   If you experience no side effects/tolerate this therapy, we may be able to increase your dose at the next follow up visit   BLOOD PRESSURE  Start taking your valsartan  40 mg, 1 pill daily - BLOOD PRESSURE  Please check your blood pressure daily following the instructions below AND bring your readings to the next outpatient visit  Blood pressure tips   It is best to check your BP 1-2 hours after taking your medications to see the medications effectiveness on your BP.    Here are some tips that our clinical pharmacists share for home BP monitoring:          Rest 10 minutes before taking your blood pressure.          Don't smoke or drink caffeinated beverages for at least 30 minutes before.          Take your blood pressure before (not after) you eat.          Sit comfortably with your back supported and both feet on the floor (don't cross your legs).          Elevate your arm to heart level on a table or a desk.          Use the proper sized cuff. It should fit smoothly and snugly around your bare upper arm. There should be enough room to slip a fingertip under the cuff.  The bottom edge of the cuff should be 1 inch above the crease of the elbow.     Take ALL your medications before you return to clinic   CHOLESTEROL  Start taking Lipitor 20 mg daily   MEDICATION ASSISTANCE I WILL email the medication assistance person today.  If you receive any medications before you were seen in the clinic please do not take them.  We will want you to be checked in here before you know how to take the medications that you received you ordered then your insulin .  If you have any problems filling your medications at community pharmacy, let us  know.  Call our office or send us  a MyChart message.   -I have ordered the following labs for you:  Lab Orders         Glucose, capillary         Comprehensive metabolic panel with GFR         Lipid Profile         POC Hbg A1C         POCT Urinalysis Dipstick (18997)      I will call if any are abnormal. All of your labs can  be accessed through My Chart.   My Chart Access: [email or web address]  Please follow-up in: 4 weeks to follow-up on your blood pressure, your diabetes (please bring your glucometer), and potential medication uptitration    We look forward to seeing you next time. Please call our clinic at [phone] if you have any questions or concerns. The best time to call is Monday-Friday from 9am-4pm, but there is someone available 24/7. If after hours or the weekend, call the main hospital number and ask for the Internal Medicine Resident On-Call. If you need medication refills, please notify your pharmacy one week in advance and they will send us  a request.   Thank you for letting us  take part in your care. Wishing you the best!  Elnora Ip, MD 02/07/2024, 11:03 AM [facility]

## 2024-02-08 LAB — COMPREHENSIVE METABOLIC PANEL WITH GFR
ALT: 9 IU/L (ref 0–44)
AST: 15 IU/L (ref 0–40)
Albumin: 4.6 g/dL (ref 3.8–4.9)
Alkaline Phosphatase: 113 IU/L (ref 44–121)
BUN/Creatinine Ratio: 14 (ref 9–20)
BUN: 16 mg/dL (ref 6–24)
Bilirubin Total: 0.4 mg/dL (ref 0.0–1.2)
CO2: 19 mmol/L — ABNORMAL LOW (ref 20–29)
Calcium: 9.8 mg/dL (ref 8.7–10.2)
Chloride: 99 mmol/L (ref 96–106)
Creatinine, Ser: 1.16 mg/dL (ref 0.76–1.27)
Globulin, Total: 2.5 g/dL (ref 1.5–4.5)
Glucose: 202 mg/dL — ABNORMAL HIGH (ref 70–99)
Potassium: 5 mmol/L (ref 3.5–5.2)
Sodium: 134 mmol/L (ref 134–144)
Total Protein: 7.1 g/dL (ref 6.0–8.5)
eGFR: 73 mL/min/1.73 (ref >59)

## 2024-02-08 LAB — LIPID PANEL
Chol/HDL Ratio: 3.5 ratio (ref 0.0–5.0)
Cholesterol, Total: 182 mg/dL (ref 100–199)
HDL: 52 mg/dL (ref >39)
LDL Chol Calc (NIH): 107 mg/dL — ABNORMAL HIGH (ref 0–99)
Triglycerides: 127 mg/dL (ref 0–149)
VLDL Cholesterol Cal: 23 mg/dL (ref 5–40)

## 2024-02-10 ENCOUNTER — Telehealth: Payer: Self-pay

## 2024-02-10 ENCOUNTER — Other Ambulatory Visit (HOSPITAL_COMMUNITY): Payer: Self-pay

## 2024-02-10 NOTE — Telephone Encounter (Signed)
 Reached out to patient regarding Xarelto  assistance.   Left message requesting call back to [phone]

## 2024-02-10 NOTE — Progress Notes (Signed)
 Pharmacy Medication Assistance Program Note    02/15/2024  Patient ID: Terry Castillo, male  DOB: Dec 30, 1965, 58 y.o.  MRN:  [id number]     02/10/2024  Outreach Medication Two  Manufacturer Medication Two [employer]  [employer] Drugs Jardiance   Dose of Jardiance  10mg   Type of Sport and exercise psychologist  Date Application Sent to Prescriber 02/15/2024  Name of Prescriber MARIA GOMEZ CARABALLO  Date Application Received From Provider 02/15/2024  Method Application Sent to Manufacturer Fax  Date Application Submitted to Manufacturer 02/15/2024     New - submitted  Completed on patients behalf. Patient confirmed this is ok in order to expedite the process.

## 2024-02-15 MED ORDER — EMPAGLIFLOZIN 10 MG PO TABS: 10.0000 mg | ORAL_TABLET | Freq: Every day | ORAL | Status: AC

## 2024-02-15 NOTE — Progress Notes (Signed)
 Discussed with Lavern Ku. Will send medication application for Xarelto  and for Jardiance  10 mg daily. If approved, medication will go to his home.  02/15/24

## 2024-02-15 NOTE — Telephone Encounter (Signed)
 Spoke with patient regarding Xarelto  assistance. Patient ok with me completing online on his behalf and if unsuccessful, he is ok with me signing on his behalf to help expedite the process.   Household size 1 Income $10 per hour, working 38 hrs/week

## 2024-02-15 NOTE — Addendum Note (Signed)
 Addended by: KRISTY ROSARIO IP on: 02/15/2024 04:38 PM   Modules accepted: Orders

## 2024-02-15 NOTE — Progress Notes (Signed)
 Pharmacy Medication Assistance Program Note    02/15/2024  Patient ID: Terry Castillo, male  DOB: [birth year], 58 y.o.  MRN:  [id number]     02/15/2024  Outreach Medication Three  Manufacturer Medication Three Other Manufacturer/Drug  Other Drugs JJPAF / XARELTO   Dose of Other Drug 20MG   Type of Assistance Manufacturer Assistance  Name of Prescriber MARIA GOMEZ-CARABALLO  Date Application Submitted to Manufacturer 02/15/2024  Method Application Sent to Manufacturer Online    Re-enrollment - submitted online via [employer] site

## 2024-02-16 ENCOUNTER — Telehealth: Payer: Self-pay

## 2024-02-20 ENCOUNTER — Ambulatory Visit: Payer: Self-pay | Admitting: Student

## 2024-02-20 NOTE — Progress Notes (Signed)
 Attempted to call patient multiple times. Lab work overall stable. Will send letter with information about Xarelto  and Jardiance  application.

## 2024-02-20 NOTE — Telephone Encounter (Signed)
 Per JJPAF, proof of income (2024 tax returns) will need to be submitted.   Contacted patient and left a message to return call to [phone].

## 2024-02-20 NOTE — Progress Notes (Signed)
 Pharmacy Medication Assistance Program Note    02/20/2024  Patient ID: Terry Castillo, male  DOB: 06/22/1965, 59 y.o.  MRN:  [id number]     02/10/2024  Outreach Medication Two  Manufacturer Medication Two [employer]  [employer] Drugs Jardiance   Dose of Jardiance  10mg   Type of Sport and exercise psychologist  Date Application Sent to Prescriber 02/15/2024  Name of Prescriber MARIA GOMEZ CARABALLO  Date Application Received From Provider 02/15/2024  Method Application Sent to Manufacturer Fax  Date Application Submitted to Manufacturer 02/15/2024  Patient Assistance Determination Approved  Approval Start Date 02/17/2024     APPROVED

## 2024-02-29 NOTE — Telephone Encounter (Signed)
 Left second message requesting call back regarding addtl documentation needed.

## 2024-02-29 NOTE — Telephone Encounter (Signed)
 Pt returned call.   Will try to come by office Friday or Monday with a copy of 2024 tax return.

## 2024-03-02 ENCOUNTER — Telehealth: Payer: Self-pay

## 2024-03-02 NOTE — Telephone Encounter (Signed)
 Attempted to contact patient via telephone this morning in reference to his appointment next week needing to rescheduled.  First telephone number [phone] tried calling it multiple times, but called would not go through.  Second telephone number [phone] called it next.  No answer, but was able to leave detailed message instructing patient to call clinic back to reschedule his appointment since the doctor will not be available that morning.

## 2024-03-06 ENCOUNTER — Other Ambulatory Visit: Payer: Self-pay

## 2024-03-06 ENCOUNTER — Ambulatory Visit: Payer: Self-pay

## 2024-03-06 ENCOUNTER — Other Ambulatory Visit (HOSPITAL_COMMUNITY): Payer: Self-pay

## 2024-03-13 NOTE — Telephone Encounter (Signed)
 Faxed tax return to [employer]

## 2024-03-19 ENCOUNTER — Ambulatory Visit: Payer: Self-pay

## 2024-03-19 ENCOUNTER — Other Ambulatory Visit (HOSPITAL_COMMUNITY): Payer: Self-pay

## 2024-03-19 ENCOUNTER — Other Ambulatory Visit: Payer: Self-pay

## 2024-03-19 VITALS — BP 139/68 | HR 89 | Temp 98.0°F | Ht 73.0 in | Wt 188.4 lb

## 2024-03-19 DIAGNOSIS — Z7984 Long term (current) use of oral hypoglycemic drugs: Secondary | ICD-10-CM

## 2024-03-19 DIAGNOSIS — Z833 Family history of diabetes mellitus: Secondary | ICD-10-CM

## 2024-03-19 DIAGNOSIS — E119 Type 2 diabetes mellitus without complications: Secondary | ICD-10-CM

## 2024-03-19 DIAGNOSIS — Z794 Long term (current) use of insulin: Secondary | ICD-10-CM

## 2024-03-19 DIAGNOSIS — F17210 Nicotine dependence, cigarettes, uncomplicated: Secondary | ICD-10-CM

## 2024-03-19 DIAGNOSIS — Z79899 Other long term (current) drug therapy: Secondary | ICD-10-CM

## 2024-03-19 DIAGNOSIS — Z7985 Long-term (current) use of injectable non-insulin antidiabetic drugs: Secondary | ICD-10-CM

## 2024-03-19 DIAGNOSIS — E785 Hyperlipidemia, unspecified: Secondary | ICD-10-CM

## 2024-03-19 DIAGNOSIS — Z8249 Family history of ischemic heart disease and other diseases of the circulatory system: Secondary | ICD-10-CM

## 2024-03-19 DIAGNOSIS — Z7982 Long term (current) use of aspirin: Secondary | ICD-10-CM

## 2024-03-19 DIAGNOSIS — I480 Paroxysmal atrial fibrillation: Secondary | ICD-10-CM

## 2024-03-19 DIAGNOSIS — I10 Essential (primary) hypertension: Secondary | ICD-10-CM

## 2024-03-19 MED ORDER — BLOOD GLUCOSE MONITORING SUPPL DEVI
0 refills | Status: AC
  Filled 2024-03-19: qty 1, fill #0

## 2024-03-19 NOTE — Assessment & Plan Note (Addendum)
 Chronic, well-controlled. BP today 139/68. Started on Valsartan  40mg  during last visit, of which patient has been taking daily. No concerns surrounding blood pressure at this visit, but discussed importance of metabolic monitoring due to starting ARB therapy. Patient unable to have lab drawn at this visit due to cost concerns, but agreeable to have BMP checked at next visit when he needs to check his A1c/lipid panel as well. Provided orange card application to help with lab costs.   -BMP at next visit to monitor K

## 2024-03-19 NOTE — Assessment & Plan Note (Addendum)
 Restarted on Lipitor 20mg  daily during last visit in September. Lipid panel from 02/07/2024 shows LDL of 107 and total cholesterol of 182. Patient adherent to his Lipitor and reports no side effects. Patient unsure as to why he needs to be taking statin therapy. Discussed how cholesterol management helps with blood pressure, diabetic, and stroke management, and patient voiced understanding. Will continue as prescribed.  - Will recheck lipid panel in 2 months

## 2024-03-19 NOTE — Progress Notes (Signed)
 Internal Medicine Clinic Attending  I was physically present during the key portions of the resident provided service and participated in the medical decision making of patient's management care. I reviewed pertinent patient test results.  The assessment, diagnosis, and plan were formulated together and I agree with the documentation in the resident's note.  Lovie Clarity, MD   The main change we made today was adding Jardiance .  Plan to check A1C and BMP at next visit (I'd prioritize these) and potentially increase Trulicity  dose

## 2024-03-19 NOTE — Assessment & Plan Note (Addendum)
 CHA?DS?-VASc: 2, for hypertension and diabetes.  Currently only on Aspirin  therapy due to lack of insurance and coverage for DOAC. Pending Xarelto  approval per Lavern Ancona, will be calling patient this week with final approval/denial. Discussed the need for better anticoagulant with patient, and patient voiced understanding but concern of cost.   - Pending Xarelto  assistance - Continue ASA 81mg  therapy, if Xarelto  is denied, consider switching to Coumadin at next visit

## 2024-03-19 NOTE — Patient Instructions (Addendum)
 Thank you, [name] for allowing us  to provide your care today. Today we discussed your diabetes and blood pressure .     New medications: -Jardiance  10mg  daily   I will call if any are abnormal. All of your labs can be accessed through My Chart.   My Chart Access: [email or web address]  Please follow-up in: 2 months to recheck your A1c and BMP and lipid panel    We look forward to seeing you next time. Please call our clinic at [phone] if you have any questions or concerns. The best time to call is Monday-Friday from 9am-4pm, but there is someone available 24/7. If after hours or the weekend, call the main hospital number and ask for the Internal Medicine Resident On-Call. If you need medication refills, please notify your pharmacy one week in advance and they will send us  a request.   Thank you for letting us  take part in your care. Wishing you the best!  Keniyah Gelinas, DO 03/19/2024, 8:32 AM [facility]

## 2024-03-19 NOTE — Progress Notes (Signed)
 Established Patient Office Visit  Subjective   Patient ID: Terry Castillo, male    DOB: 1965-09-04  Age: 58 y.o. MRN: [id number]  Chief Complaint  Patient presents with   Follow-up   Diabetes    A1c  done 9/9 + 9.3   Hypertension   Medication Problem    Pt would like to discuss his meds and te reason for taking all of them     Terry Castillo is a 58 year old male with pertinent past medical history of type 2 diabetes mellitus, hypertension, hyperlipidemia, atrial fibrillation, and history of tobacco use who presents today for further diabetes management.  Please see problem-based assessment and plan below for details.    Review of Systems  Constitutional: Negative.   HENT: Negative.    Eyes:  Negative for blurred vision.  Cardiovascular: Negative.   Gastrointestinal:  Positive for nausea.       Minimal, goes away after some time after Trulicity  injections  Genitourinary: Negative.   Skin: Negative.   Neurological:  Negative for dizziness, tremors and headaches.  Psychiatric/Behavioral: Negative.        Objective:    BP 139/68 (BP Location: Right Arm, Patient Position: Sitting, Cuff Size: Small)   Pulse 89   Temp 98 F (36.7 C) (Oral)   Ht 6' 1 (1.854 m)   Wt 188 lb 6.4 oz (85.5 kg)   SpO2 100%   BMI 24.86 kg/m   Physical Exam Constitutional:      Appearance: Normal appearance. He is normal weight.  HENT:     Mouth/Throat:     Mouth: Mucous membranes are moist.  Cardiovascular:     Rate and Rhythm: Normal rate and regular rhythm.     Pulses: Normal pulses.     Heart sounds: Normal heart sounds.  Pulmonary:     Effort: Pulmonary effort is normal.     Breath sounds: Normal breath sounds.  Abdominal:     General: Abdomen is flat. Bowel sounds are normal.     Palpations: Abdomen is soft.  Musculoskeletal:        General: Normal range of motion.     Cervical back: Normal range of motion.  Skin:    General: Skin is warm and dry.  Neurological:     General:  No focal deficit present.     Mental Status: He is alert and oriented to person, place, and time.  Psychiatric:        Mood and Affect: Mood normal.        Behavior: Behavior normal.       Assessment & Plan:   Patient was seen with Dr.Machen  Problem List Items Addressed This Visit       Cardiovascular and Mediastinum   Paroxysmal atrial fibrillation (HCC)   CHA?DS?-VASc: 2, for hypertension and diabetes.  Currently only on Aspirin  therapy due to lack of insurance and coverage for DOAC. Pending Xarelto  approval per Lavern Ancona, will be calling patient this week with final approval/denial. Discussed the need for better anticoagulant with patient, and patient voiced understanding but concern of cost.   - Pending Xarelto  assistance - Continue ASA 81mg  therapy, if Xarelto  is denied, consider switching to Coumadin at next visit      Essential hypertension   Chronic, well-controlled. BP today 139/68. Started on Valsartan  40mg  during last visit, of which patient has been taking daily. No concerns surrounding blood pressure at this visit, but discussed importance of metabolic monitoring due to starting ARB therapy. Patient  unable to have lab drawn at this visit due to cost concerns, but agreeable to have BMP checked at next visit when he needs to check his A1c/lipid panel as well. Provided orange card application to help with lab costs.   -BMP at next visit to monitor K        Endocrine   Diabetes mellitus type 2, insulin  dependent (HCC) - Primary (Chronic)   Last A1c 9.3% on 02/07/24. Improved from 10.6 in 07/2023. Regimen includes insulin  70/30 17 units BID and Metformin  1000mg  BID. Foot exam with no abnormalities, palpable DP/PT pulses today. Patient reports feeling mostly well without side effects. Some intermittent nausea on days he takes his Trulicity , however this dissipates. No diarrhea, abdominal pain, early satiety. No reported low blood sugar symptoms, however states his  glucometer has broken and is unable to check his BG regularly. Has been taking Trulicity  0.75mg  injections on Mondays, but is hesitant to increase dosage at this time since he recently was approved for Jardiance  10mg  daily. Patient is unsure if he should be taking all of his medications at once. Assured patient his medications are appropriate for his diabetes and are being monitored to ensure he is at optimal doses. Discussed continuing dietary and movement practices to help with his diabetes. Patient unable to have uACR done today due to cost, and discussed limitations of proteinuria that can be found on UA vs uACR.   - Check A1c, BMP, uACR at next visit in 2 months - Continue insulin  70/30 17u BID, Metformin  1000mg  BID, Trulicity  0.7mg  weekly on Mondays - Start Jardiance  10mg  daily - Uptitrate dulaglutide  to 1.5mg  weekly at next visit if without side effects - Glucometer to pharmacy, will consider sending order for CGM if glucometer cost is high       Relevant Medications   Blood Glucose Monitoring Suppl DEVI     Other   Hyperlipidemia   Restarted on Lipitor 20mg  daily during last visit in September. Lipid panel from 02/07/2024 shows LDL of 107 and total cholesterol of 182. Patient adherent to his Lipitor and reports no side effects. Patient unsure as to why he needs to be taking statin therapy. Discussed how cholesterol management helps with blood pressure, diabetic, and stroke management, and patient voiced understanding. Will continue as prescribed.  - Will recheck lipid panel in 2 months        Return in about 2 months (around 05/19/2024) for A1c, lipid panel, and BMP check.    Monita Swier, DO Internal Medicine Resident, PGY-1 10:13 AM 03/19/2024

## 2024-03-19 NOTE — Assessment & Plan Note (Addendum)
 Last A1c 9.3% on 02/07/24. Improved from 10.6 in 07/2023. Regimen includes insulin  70/30 17 units BID and Metformin  1000mg  BID. Foot exam with no abnormalities, palpable DP/PT pulses today. Patient reports feeling mostly well without side effects. Some intermittent nausea on days he takes his Trulicity , however this dissipates. No diarrhea, abdominal pain, early satiety. No reported low blood sugar symptoms, however states his glucometer has broken and is unable to check his BG regularly. Has been taking Trulicity  0.75mg  injections on Mondays, but is hesitant to increase dosage at this time since he recently was approved for Jardiance  10mg  daily. Patient is unsure if he should be taking all of his medications at once. Assured patient his medications are appropriate for his diabetes and are being monitored to ensure he is at optimal doses. Discussed continuing dietary and movement practices to help with his diabetes. Patient unable to have uACR done today due to cost, and discussed limitations of proteinuria that can be found on UA vs uACR.   - Check A1c, BMP, uACR at next visit in 2 months - Continue insulin  70/30 17u BID, Metformin  1000mg  BID, Trulicity  0.7mg  weekly on Mondays - Start Jardiance  10mg  daily - Uptitrate dulaglutide  to 1.5mg  weekly at next visit if without side effects - Glucometer to pharmacy, will consider sending order for CGM if glucometer cost is high

## 2024-03-28 NOTE — Telephone Encounter (Signed)
 Left message informing patient his insulin  is ready for pickup.   3 boxes of Novolog  Flexpens labeled in med room fridge - 132 day supply

## 2024-03-28 NOTE — Telephone Encounter (Signed)
 Reached out to [employer]  Per rep: addtl info needed. Prescription portion received, but no patient portion. Explained this was completed online. Rep said paper portion could be completed and sent in.  Will reach out to patient regarding signature.

## 2024-04-04 NOTE — Telephone Encounter (Signed)
 Faxed completed page to [employer].

## 2024-04-05 NOTE — Telephone Encounter (Signed)
 PAP: Patient assistance application for Xarelto  has been approved by [employer]: [employer] from 04/04/24 to 04/04/25.   Medication should be delivered to: Home.   For further shipping updates, please contact [facility] [phone].

## 2024-04-12 ENCOUNTER — Other Ambulatory Visit: Payer: Self-pay

## 2024-05-03 NOTE — Telephone Encounter (Signed)
 Left message informing patient of med pickup.

## 2024-05-03 NOTE — Telephone Encounter (Signed)
 Patient here for medication pick up.  Patient picked up 3 boxes of Novolog  Flex Pens.  Given to patient.

## 2024-05-21 ENCOUNTER — Other Ambulatory Visit: Payer: Self-pay

## 2024-06-13 NOTE — Telephone Encounter (Signed)
 Updated enrollment end date:

## 2024-06-18 ENCOUNTER — Other Ambulatory Visit (HOSPITAL_COMMUNITY): Payer: Self-pay

## 2024-06-18 ENCOUNTER — Other Ambulatory Visit: Payer: Self-pay

## 2024-06-20 ENCOUNTER — Other Ambulatory Visit (HOSPITAL_BASED_OUTPATIENT_CLINIC_OR_DEPARTMENT_OTHER): Payer: Self-pay
